# Patient Record
Sex: Male | Born: 1965
Health system: Southern US, Community
[De-identification: ages and names within clinical notes are randomized; demographics above are authoritative.]

## PROBLEM LIST (undated history)

## (undated) DIAGNOSIS — N200 Calculus of kidney: Secondary | ICD-10-CM

## (undated) DIAGNOSIS — Z860100 Personal history of colon polyps, unspecified: Secondary | ICD-10-CM

## (undated) DIAGNOSIS — G4733 Obstructive sleep apnea (adult) (pediatric): Secondary | ICD-10-CM

## (undated) DIAGNOSIS — Z9989 Dependence on other enabling machines and devices: Secondary | ICD-10-CM

## (undated) DIAGNOSIS — E669 Obesity, unspecified: Secondary | ICD-10-CM

## (undated) DIAGNOSIS — K224 Dyskinesia of esophagus: Secondary | ICD-10-CM

## (undated) DIAGNOSIS — E119 Type 2 diabetes mellitus without complications: Secondary | ICD-10-CM

## (undated) HISTORY — PX: NM RENAL LASIX (ARMC HX): HXRAD1213

## (undated) HISTORY — DX: Obesity, unspecified: E66.9

## (undated) HISTORY — DX: Type 2 diabetes mellitus without complications: E11.9

## (undated) HISTORY — DX: Personal history of colonic polyps: Z86.010

## (undated) HISTORY — DX: Personal history of colon polyps, unspecified: Z86.0100

---

## 2009-07-01 HISTORY — PX: CARDIAC CATHETERIZATION: SHX172

## 2014-05-01 DIAGNOSIS — N200 Calculus of kidney: Secondary | ICD-10-CM

## 2014-05-01 HISTORY — DX: Calculus of kidney: N20.0

## 2014-05-26 ENCOUNTER — Encounter (HOSPITAL_BASED_OUTPATIENT_CLINIC_OR_DEPARTMENT_OTHER): Payer: Self-pay | Admitting: Emergency Medicine

## 2014-05-26 ENCOUNTER — Emergency Department (HOSPITAL_BASED_OUTPATIENT_CLINIC_OR_DEPARTMENT_OTHER): Payer: Self-pay

## 2014-05-26 ENCOUNTER — Emergency Department (HOSPITAL_BASED_OUTPATIENT_CLINIC_OR_DEPARTMENT_OTHER)
Admission: EM | Admit: 2014-05-26 | Discharge: 2014-05-26 | Disposition: A | Discharge status: Home / Self Care | Payer: Self-pay | Attending: Emergency Medicine | Admitting: Emergency Medicine

## 2014-05-26 DIAGNOSIS — N200 Calculus of kidney: Secondary | ICD-10-CM

## 2014-05-26 DIAGNOSIS — R52 Pain, unspecified: Secondary | ICD-10-CM

## 2014-05-26 DIAGNOSIS — Z8719 Personal history of other diseases of the digestive system: Secondary | ICD-10-CM | POA: Insufficient documentation

## 2014-05-26 DIAGNOSIS — Z8669 Personal history of other diseases of the nervous system and sense organs: Secondary | ICD-10-CM | POA: Insufficient documentation

## 2014-05-26 DIAGNOSIS — Z79899 Other long term (current) drug therapy: Secondary | ICD-10-CM | POA: Insufficient documentation

## 2014-05-26 HISTORY — DX: Dyskinesia of esophagus: K22.4

## 2014-05-26 LAB — URINALYSIS, ROUTINE W REFLEX MICROSCOPIC
Bilirubin Urine: NEGATIVE
Glucose, UA: NEGATIVE mg/dL
KETONES UR: 15 mg/dL — AB
NITRITE: NEGATIVE
PROTEIN: 30 mg/dL — AB
Specific Gravity, Urine: 1.024 (ref 1.005–1.030)
UROBILINOGEN UA: 1 mg/dL (ref 0.0–1.0)
pH: 7 (ref 5.0–8.0)

## 2014-05-26 LAB — CBC WITH DIFFERENTIAL/PLATELET
BASOS ABS: 0 10*3/uL (ref 0.0–0.1)
Basophils Relative: 0 % (ref 0–1)
Eosinophils Absolute: 0.3 10*3/uL (ref 0.0–0.7)
Eosinophils Relative: 4 % (ref 0–5)
HCT: 43.9 % (ref 39.0–52.0)
Hemoglobin: 15 g/dL (ref 13.0–17.0)
LYMPHS PCT: 32 % (ref 12–46)
Lymphs Abs: 2.5 10*3/uL (ref 0.7–4.0)
MCH: 29.1 pg (ref 26.0–34.0)
MCHC: 34.2 g/dL (ref 30.0–36.0)
MCV: 85.1 fL (ref 78.0–100.0)
Monocytes Absolute: 0.6 10*3/uL (ref 0.1–1.0)
Monocytes Relative: 7 % (ref 3–12)
Neutro Abs: 4.5 10*3/uL (ref 1.7–7.7)
Neutrophils Relative %: 57 % (ref 43–77)
Platelets: 196 10*3/uL (ref 150–400)
RBC: 5.16 MIL/uL (ref 4.22–5.81)
RDW: 12.9 % (ref 11.5–15.5)
WBC: 7.8 10*3/uL (ref 4.0–10.5)

## 2014-05-26 LAB — COMPREHENSIVE METABOLIC PANEL
ALT: 38 U/L (ref 0–53)
ANION GAP: 17 — AB (ref 5–15)
AST: 25 U/L (ref 0–37)
Albumin: 4.1 g/dL (ref 3.5–5.2)
Alkaline Phosphatase: 54 U/L (ref 39–117)
BUN: 12 mg/dL (ref 6–23)
CO2: 23 mEq/L (ref 19–32)
CREATININE: 1.1 mg/dL (ref 0.50–1.35)
Calcium: 9.9 mg/dL (ref 8.4–10.5)
Chloride: 99 mEq/L (ref 96–112)
GFR calc non Af Amer: 78 mL/min — ABNORMAL LOW (ref >90)
Glucose, Bld: 237 mg/dL — ABNORMAL HIGH (ref 70–99)
Potassium: 3.9 mEq/L (ref 3.7–5.3)
Sodium: 139 mEq/L (ref 137–147)
Total Bilirubin: 0.4 mg/dL (ref 0.3–1.2)
Total Protein: 8.1 g/dL (ref 6.0–8.3)

## 2014-05-26 LAB — LIPASE, BLOOD: Lipase: 41 U/L (ref 11–59)

## 2014-05-26 LAB — URINE MICROSCOPIC-ADD ON

## 2014-05-26 MED ORDER — OXYCODONE-ACETAMINOPHEN 5-325 MG PO TABS: 1.0000 | ORAL_TABLET | Freq: Four times a day (QID) | ORAL | Status: DC | PRN

## 2014-05-26 MED ORDER — DICLOFENAC SODIUM ER 100 MG PO TB24: 100.0000 mg | ORAL_TABLET | Freq: Every day | ORAL | Status: DC

## 2014-05-26 MED ORDER — HYDROMORPHONE HCL 1 MG/ML IJ SOLN
1.0000 mg | Freq: Once | INTRAMUSCULAR | Status: AC
  Administered 2014-05-26: 1 mg via INTRAVENOUS

## 2014-05-26 MED ORDER — KETOROLAC TROMETHAMINE 30 MG/ML IJ SOLN
30.0000 mg | Freq: Once | INTRAMUSCULAR | Status: AC
  Administered 2014-05-26: 30 mg via INTRAVENOUS
  Filled 2014-05-26: qty 1

## 2014-05-26 MED ORDER — ONDANSETRON 8 MG PO TBDP: ORAL_TABLET | ORAL | Status: DC

## 2014-05-26 MED ORDER — OXYCODONE-ACETAMINOPHEN 5-325 MG PO TABS
2.0000 | ORAL_TABLET | Freq: Once | ORAL | Status: AC
  Administered 2014-05-26: 2 via ORAL
  Filled 2014-05-26: qty 2

## 2014-05-26 MED ORDER — IOHEXOL 300 MG/ML  SOLN
100.0000 mL | Freq: Once | INTRAMUSCULAR | Status: AC | PRN
  Administered 2014-05-26: 100 mL via INTRAVENOUS

## 2014-05-26 MED ORDER — IOHEXOL 300 MG/ML  SOLN
25.0000 mL | Freq: Once | INTRAMUSCULAR | Status: AC | PRN
  Administered 2014-05-26: 25 mL via ORAL

## 2014-05-26 MED ORDER — DICYCLOMINE HCL 10 MG/ML IM SOLN
20.0000 mg | Freq: Once | INTRAMUSCULAR | Status: AC
  Administered 2014-05-26: 20 mg via INTRAMUSCULAR
  Filled 2014-05-26: qty 2

## 2014-05-26 MED ORDER — SODIUM CHLORIDE 0.9 % IV BOLUS (SEPSIS)
500.0000 mL | Freq: Once | INTRAVENOUS | Status: AC
Start: 2014-05-26 — End: 2014-05-26
  Administered 2014-05-26: 500 mL via INTRAVENOUS

## 2014-05-26 MED ORDER — HYDROMORPHONE HCL 1 MG/ML IJ SOLN
INTRAMUSCULAR | Status: AC
Start: 2014-05-26 — End: 2014-05-26
  Administered 2014-05-26: 1 mg via INTRAVENOUS
  Filled 2014-05-26: qty 1

## 2014-05-26 MED ORDER — ONDANSETRON HCL 4 MG/2ML IJ SOLN
INTRAMUSCULAR | Status: AC
  Administered 2014-05-26: 4 mg via INTRAVENOUS
  Filled 2014-05-26: qty 2

## 2014-05-26 MED ORDER — TAMSULOSIN HCL 0.4 MG PO CAPS: 0.4000 mg | ORAL_CAPSULE | Freq: Every day | ORAL | Status: DC

## 2014-05-26 MED ORDER — HYDROMORPHONE HCL 1 MG/ML IJ SOLN
INTRAMUSCULAR | Status: DC
Start: 2014-05-26 — End: 2014-05-26
  Filled 2014-05-26: qty 1

## 2014-05-26 MED ORDER — TAMSULOSIN HCL 0.4 MG PO CAPS
0.4000 mg | ORAL_CAPSULE | Freq: Every day | ORAL | Status: DC
  Administered 2014-05-26: 0.4 mg via ORAL
  Filled 2014-05-26: qty 1

## 2014-05-26 MED ORDER — SODIUM CHLORIDE 0.9 % IV SOLN
Freq: Once | INTRAVENOUS | Status: AC
  Administered 2014-05-26: 1000 mL/h via INTRAVENOUS

## 2014-05-26 MED ORDER — ONDANSETRON HCL 4 MG/2ML IJ SOLN
4.0000 mg | Freq: Once | INTRAMUSCULAR | Status: AC
  Administered 2014-05-26: 4 mg via INTRAVENOUS

## 2014-05-26 NOTE — ED Notes (Signed)
C/o ruq pain, feels like needs to have bm, after bm tonight pt developed severe pain

## 2014-05-26 NOTE — ED Notes (Signed)
MD at bedside. 

## 2014-05-26 NOTE — ED Provider Notes (Signed)
CSN: [id number]     Arrival date & time 05/26/14  0250 History   First MD Initiated Contact with Patient 05/26/14 0251     Chief Complaint  Patient presents with  . Abdominal Pain     (Consider location/radiation/quality/duration/timing/severity/associated sxs/prior Treatment) Patient is a 48 y.o. male presenting with abdominal pain. The history is provided by the patient.  Abdominal Pain Pain location:  RUQ Pain quality: cramping   Pain radiates to:  Does not radiate Pain severity:  Severe Onset quality:  Sudden Timing:  Constant Progression:  Unchanged Chronicity:  New Context: not previous surgeries   Relieved by:  Nothing Worsened by:  Bowel movements Ineffective treatments:  None tried Associated symptoms: no chest pain, no constipation, no dysuria, no hematuria and no shortness of breath   Risk factors: no recent hospitalization     Past Medical History  Diagnosis Date  . Esophageal spasm   . Sleep apnea    History reviewed. No pertinent past surgical history. History reviewed. No pertinent family history. History  Substance Use Topics  . Smoking status: Never Smoker   . Smokeless tobacco: Not on file  . Alcohol Use: No    Review of Systems  Respiratory: Negative for shortness of breath.   Cardiovascular: Negative for chest pain.  Gastrointestinal: Positive for abdominal pain. Negative for constipation.  Genitourinary: Negative for dysuria and hematuria.  All other systems reviewed and are negative.     Allergies  Review of patient's allergies indicates no known allergies.  Home Medications   Prior to Admission medications   Medication Sig Start Date End Date Taking? Authorizing Provider  verapamil (CALAN) 120 MG tablet Take 120 mg by mouth 3 (three) times daily.   Yes Historical Provider, MD   BP 157/93 mmHg  Pulse 78  Temp(Src) 98.9 F (37.2 C) (Oral)  Resp 20  Ht 6\' 1"  (1.854 m)  Wt 350 lb (158.759 kg)  BMI 46.19 kg/m2  SpO2  100% Physical Exam  Constitutional: He is oriented to person, place, and time. He appears well-developed and well-nourished. No distress.  HENT:  Head: Normocephalic and atraumatic.  Mouth/Throat: Oropharynx is clear and moist.  Eyes: Conjunctivae are normal. Pupils are equal, round, and reactive to light.  Neck: Normal range of motion. Neck supple.  Cardiovascular: Normal rate and regular rhythm.   Pulmonary/Chest: Effort normal and breath sounds normal. He has no wheezes. He has no rales.  Abdominal: Soft. Bowel sounds are normal. There is tenderness in the right upper quadrant. There is no rigidity, no rebound, no guarding, no tenderness at McBurney's point and negative Murphy's sign.  Musculoskeletal: Normal range of motion.  Neurological: He is alert and oriented to person, place, and time.  Skin: Skin is warm and dry.  Psychiatric: He has a normal mood and affect.    ED Course  Procedures (including critical care time) Labs Review Labs Reviewed  COMPREHENSIVE METABOLIC PANEL - Abnormal; Notable for the following:    Glucose, Bld 237 (*)    GFR calc non Af Amer 78 (*)    Anion gap 17 (*)    All other components within normal limits  URINALYSIS, ROUTINE W REFLEX MICROSCOPIC - Abnormal; Notable for the following:    Color, Urine AMBER (*)    APPearance CLOUDY (*)    Hgb urine dipstick LARGE (*)    Ketones, ur 15 (*)    Protein, ur 30 (*)    Leukocytes, UA SMALL (*)    All other components  within normal limits  CBC WITH DIFFERENTIAL  LIPASE, BLOOD  URINE MICROSCOPIC-ADD ON    Imaging Review Ct Abdomen Pelvis W Contrast  05/26/2014   CLINICAL DATA:  Right flank and abdominal pain.  EXAM: CT ABDOMEN AND PELVIS WITH CONTRAST  TECHNIQUE: Multidetector CT imaging of the abdomen and pelvis was performed using the standard protocol following bolus administration of intravenous contrast.  CONTRAST:  43mL OMNIPAQUE IOHEXOL 300 MG/ML SOLN, 152mL OMNIPAQUE IOHEXOL 300 MG/ML SOLN   COMPARISON:  None.  FINDINGS: BODY WALL: Unremarkable.  LOWER CHEST: Unremarkable.  ABDOMEN/PELVIS:  Liver: Diffuse fatty infiltration.  Biliary: No evidence of biliary obstruction or stone.  Pancreas: Unremarkable.  Spleen: Unremarkable.  Adrenals: Unremarkable.  Kidneys and ureters: There is a 4 mm stone in the distal right ureter with periureteral edema and mild hydronephrosis. No additional urolithiasis identified.  Bladder: Unremarkable.  Reproductive: Unremarkable.  Bowel: No obstruction. Normal appendix.  Retroperitoneum: No mass or adenopathy.  Peritoneum: No ascites or pneumoperitoneum.  Vascular: No acute abnormality.  OSSEOUS: No acute abnormalities.  IMPRESSION: 1. 4 mm stone in the distal right ureter with mild obstructive uropathy. 2. Hepatic steatosis.   Electronically Signed   By: Jorje Guild M.D.   On: 05/26/2014 04:04     EKG Interpretation   Date/Time:  Thursday May 26 2014 02:55:24 EST Ventricular Rate:  66 PR Interval:  154 QRS Duration: 100 QT Interval:  402 QTC Calculation: 421 R Axis:   66 Text Interpretation:  Normal sinus rhythm Confirmed by [facility]  MD,  Peg Fifer ([id number]) on 05/26/2014 3:03:00 AM      MDM   Final diagnoses:  Pain   Medications  HYDROmorphone (DILAUDID) injection 1 mg (1 mg Intravenous Given 05/26/14 0257)  dicyclomine (BENTYL) injection 20 mg (20 mg Intramuscular Given 05/26/14 0257)  ondansetron (ZOFRAN) injection 4 mg (4 mg Intravenous Given 05/26/14 0257)  sodium chloride 0.9 % bolus 500 mL (0 mLs Intravenous Stopped 05/26/14 0339)  HYDROmorphone (DILAUDID) injection 1 mg (1 mg Intravenous Given 05/26/14 0307)  iohexol (OMNIPAQUE) 300 MG/ML solution 25 mL (25 mLs Oral Contrast Given 05/26/14 0346)  iohexol (OMNIPAQUE) 300 MG/ML solution 100 mL (100 mLs Intravenous Contrast Given 05/26/14 0346)  ketorolac (TORADOL) 30 MG/ML injection 30 mg (30 mg Intravenous Given 05/26/14 0338)  0.9 %  sodium chloride infusion (1,000 mL/hr  Intravenous New Bag/Given 05/26/14 0339)    Will prescribe flomax, percocet, zofran ODT, voltaren and give strainer.  Follow up with urology in 7 days.  Strain all urine.  Give elevated blood sugar follow up with your PMD for ongoing testing adhere to a lower carb diet and drink lots of water.      Carlisle Beers, MD 05/26/14 [phone]

## 2014-10-20 ENCOUNTER — Encounter (HOSPITAL_BASED_OUTPATIENT_CLINIC_OR_DEPARTMENT_OTHER): Payer: Self-pay | Admitting: *Deleted

## 2014-10-20 ENCOUNTER — Inpatient Hospital Stay (HOSPITAL_BASED_OUTPATIENT_CLINIC_OR_DEPARTMENT_OTHER)
Admission: EM | Admit: 2014-10-20 | Discharge: 2014-10-22 | DRG: 153 | Disposition: A | Discharge status: Home / Self Care | Payer: 59 | Attending: Internal Medicine | Admitting: Internal Medicine

## 2014-10-20 ENCOUNTER — Emergency Department (HOSPITAL_BASED_OUTPATIENT_CLINIC_OR_DEPARTMENT_OTHER): Payer: 59

## 2014-10-20 DIAGNOSIS — R739 Hyperglycemia, unspecified: Secondary | ICD-10-CM | POA: Diagnosis present

## 2014-10-20 DIAGNOSIS — T380X5A Adverse effect of glucocorticoids and synthetic analogues, initial encounter: Secondary | ICD-10-CM | POA: Diagnosis present

## 2014-10-20 DIAGNOSIS — G4733 Obstructive sleep apnea (adult) (pediatric): Secondary | ICD-10-CM | POA: Diagnosis present

## 2014-10-20 DIAGNOSIS — R0609 Other forms of dyspnea: Secondary | ICD-10-CM | POA: Diagnosis not present

## 2014-10-20 DIAGNOSIS — E669 Obesity, unspecified: Secondary | ICD-10-CM | POA: Diagnosis present

## 2014-10-20 DIAGNOSIS — Z7901 Long term (current) use of anticoagulants: Secondary | ICD-10-CM | POA: Diagnosis not present

## 2014-10-20 DIAGNOSIS — I313 Pericardial effusion (noninflammatory): Secondary | ICD-10-CM | POA: Diagnosis present

## 2014-10-20 DIAGNOSIS — J9801 Acute bronchospasm: Secondary | ICD-10-CM | POA: Diagnosis present

## 2014-10-20 DIAGNOSIS — Z6841 Body Mass Index (BMI) 40.0 and over, adult: Secondary | ICD-10-CM

## 2014-10-20 DIAGNOSIS — Z21 Asymptomatic human immunodeficiency virus [HIV] infection status: Secondary | ICD-10-CM | POA: Diagnosis present

## 2014-10-20 DIAGNOSIS — I300 Acute nonspecific idiopathic pericarditis: Secondary | ICD-10-CM | POA: Diagnosis not present

## 2014-10-20 DIAGNOSIS — Z7982 Long term (current) use of aspirin: Secondary | ICD-10-CM

## 2014-10-20 DIAGNOSIS — I319 Disease of pericardium, unspecified: Secondary | ICD-10-CM | POA: Diagnosis present

## 2014-10-20 DIAGNOSIS — Z87442 Personal history of urinary calculi: Secondary | ICD-10-CM | POA: Diagnosis not present

## 2014-10-20 DIAGNOSIS — G4730 Sleep apnea, unspecified: Secondary | ICD-10-CM | POA: Diagnosis not present

## 2014-10-20 DIAGNOSIS — I3139 Other pericardial effusion (noninflammatory): Secondary | ICD-10-CM

## 2014-10-20 DIAGNOSIS — K224 Dyskinesia of esophagus: Secondary | ICD-10-CM | POA: Diagnosis present

## 2014-10-20 DIAGNOSIS — J020 Streptococcal pharyngitis: Secondary | ICD-10-CM

## 2014-10-20 DIAGNOSIS — J069 Acute upper respiratory infection, unspecified: Secondary | ICD-10-CM | POA: Diagnosis present

## 2014-10-20 DIAGNOSIS — R51 Headache: Secondary | ICD-10-CM | POA: Diagnosis present

## 2014-10-20 DIAGNOSIS — R0602 Shortness of breath: Secondary | ICD-10-CM

## 2014-10-20 HISTORY — DX: Dependence on other enabling machines and devices: Z99.89

## 2014-10-20 HISTORY — DX: Calculus of kidney: N20.0

## 2014-10-20 HISTORY — DX: Obstructive sleep apnea (adult) (pediatric): G47.33

## 2014-10-20 LAB — CBC
HCT: 46.4 % (ref 39.0–52.0)
Hemoglobin: 15.9 g/dL (ref 13.0–17.0)
MCH: 28.7 pg (ref 26.0–34.0)
MCHC: 34.3 g/dL (ref 30.0–36.0)
MCV: 83.8 fL (ref 78.0–100.0)
PLATELETS: 230 10*3/uL (ref 150–400)
RBC: 5.54 MIL/uL (ref 4.22–5.81)
RDW: 12.9 % (ref 11.5–15.5)
WBC: 8.5 10*3/uL (ref 4.0–10.5)

## 2014-10-20 LAB — BASIC METABOLIC PANEL
Anion gap: 9 (ref 5–15)
BUN: 13 mg/dL (ref 6–23)
CO2: 24 mmol/L (ref 19–32)
CREATININE: 1 mg/dL (ref 0.50–1.35)
Calcium: 9.5 mg/dL (ref 8.4–10.5)
Chloride: 103 mmol/L (ref 96–112)
GFR calc Af Amer: >90 mL/min (ref >90)
GFR calc non Af Amer: 88 mL/min — ABNORMAL LOW (ref >90)
GLUCOSE: 146 mg/dL — AB (ref 70–99)
Potassium: 4.1 mmol/L (ref 3.5–5.1)
Sodium: 136 mmol/L (ref 135–145)

## 2014-10-20 LAB — BRAIN NATRIURETIC PEPTIDE: B Natriuretic Peptide: 8.8 pg/mL (ref 0.0–100.0)

## 2014-10-20 LAB — D-DIMER, QUANTITATIVE: D-Dimer, Quant: 0.36 ug/mL-FEU (ref 0.00–0.48)

## 2014-10-20 LAB — TROPONIN I

## 2014-10-20 MED ORDER — AZITHROMYCIN 250 MG PO TABS
250.0000 mg | ORAL_TABLET | Freq: Every day | ORAL | Status: DC
  Administered 2014-10-21 – 2014-10-22 (×2): 250 mg via ORAL
  Filled 2014-10-20 (×2): qty 1

## 2014-10-20 MED ORDER — METHYLPREDNISOLONE SODIUM SUCC 125 MG IJ SOLR
60.0000 mg | Freq: Two times a day (BID) | INTRAMUSCULAR | Status: AC
  Administered 2014-10-20 – 2014-10-21 (×3): 60 mg via INTRAVENOUS
  Filled 2014-10-20: qty 2
  Filled 2014-10-20 (×2): qty 0.96

## 2014-10-20 MED ORDER — PANTOPRAZOLE SODIUM 40 MG PO TBEC
40.0000 mg | DELAYED_RELEASE_TABLET | Freq: Every day | ORAL | Status: DC
  Administered 2014-10-20 – 2014-10-21 (×2): 40 mg via ORAL
  Filled 2014-10-20 (×3): qty 1

## 2014-10-20 MED ORDER — OXYCODONE-ACETAMINOPHEN 5-325 MG PO TABS: 1.0000 | ORAL_TABLET | Freq: Four times a day (QID) | ORAL | Status: DC | PRN

## 2014-10-20 MED ORDER — VERAPAMIL HCL 120 MG PO TABS
120.0000 mg | ORAL_TABLET | Freq: Every day | ORAL | Status: DC | PRN
  Filled 2014-10-20: qty 1

## 2014-10-20 MED ORDER — AZITHROMYCIN 500 MG PO TABS
500.0000 mg | ORAL_TABLET | Freq: Every day | ORAL | Status: AC
  Administered 2014-10-20: 500 mg via ORAL
  Filled 2014-10-20: qty 1

## 2014-10-20 MED ORDER — ONDANSETRON 4 MG PO TBDP
4.0000 mg | ORAL_TABLET | Freq: Three times a day (TID) | ORAL | Status: DC | PRN
  Filled 2014-10-20: qty 1

## 2014-10-20 MED ORDER — ACETAMINOPHEN 325 MG PO TABS
650.0000 mg | ORAL_TABLET | Freq: Four times a day (QID) | ORAL | Status: DC | PRN
  Filled 2014-10-20: qty 2

## 2014-10-20 MED ORDER — ACETAMINOPHEN 650 MG RE SUPP: 650.0000 mg | Freq: Four times a day (QID) | RECTAL | Status: DC | PRN

## 2014-10-20 MED ORDER — MORPHINE SULFATE 2 MG/ML IJ SOLN: 2.0000 mg | INTRAMUSCULAR | Status: DC | PRN

## 2014-10-20 MED ORDER — SODIUM CHLORIDE 0.9 % IV SOLN
INTRAVENOUS | Status: DC
  Administered 2014-10-20: via INTRAVENOUS

## 2014-10-20 MED ORDER — ALUM & MAG HYDROXIDE-SIMETH 200-200-20 MG/5ML PO SUSP: 30.0000 mL | Freq: Four times a day (QID) | ORAL | Status: DC | PRN

## 2014-10-20 MED ORDER — ASPIRIN 325 MG PO TABS
650.0000 mg | ORAL_TABLET | Freq: Three times a day (TID) | ORAL | Status: DC
  Administered 2014-10-20 – 2014-10-22 (×5): 650 mg via ORAL
  Filled 2014-10-20 (×8): qty 2

## 2014-10-20 MED ORDER — NITROGLYCERIN 0.4 MG SL SUBL: 0.4000 mg | SUBLINGUAL_TABLET | SUBLINGUAL | Status: DC | PRN

## 2014-10-20 MED ORDER — IPRATROPIUM-ALBUTEROL 0.5-2.5 (3) MG/3ML IN SOLN: 3.0000 mL | RESPIRATORY_TRACT | Status: DC

## 2014-10-20 MED ORDER — ZOLPIDEM TARTRATE 5 MG PO TABS: 10.0000 mg | ORAL_TABLET | Freq: Every evening | ORAL | Status: DC | PRN

## 2014-10-20 MED ORDER — ALBUTEROL SULFATE (2.5 MG/3ML) 0.083% IN NEBU: 2.5000 mg | INHALATION_SOLUTION | RESPIRATORY_TRACT | Status: DC | PRN

## 2014-10-20 MED ORDER — SODIUM CHLORIDE 0.9 % IJ SOLN
3.0000 mL | Freq: Two times a day (BID) | INTRAMUSCULAR | Status: DC
  Administered 2014-10-20 – 2014-10-22 (×4): 3 mL via INTRAVENOUS

## 2014-10-20 MED ORDER — GUAIFENESIN-DM 100-10 MG/5ML PO SYRP
5.0000 mL | ORAL_SOLUTION | ORAL | Status: DC | PRN
  Filled 2014-10-20: qty 5

## 2014-10-20 MED ORDER — HEPARIN SODIUM (PORCINE) 5000 UNIT/ML IJ SOLN
5000.0000 [IU] | Freq: Three times a day (TID) | INTRAMUSCULAR | Status: DC
Start: 2014-10-20 — End: 2014-10-22
  Administered 2014-10-20 – 2014-10-22 (×5): 5000 [IU] via SUBCUTANEOUS
  Filled 2014-10-20 (×8): qty 1

## 2014-10-20 NOTE — ED Notes (Signed)
MD at bedside. 

## 2014-10-20 NOTE — ED Notes (Signed)
Patient transported to X-ray 

## 2014-10-20 NOTE — ED Notes (Signed)
SOB on exertion x 1 week- recent hx of strep and treatment with augmentin

## 2014-10-20 NOTE — ED Notes (Signed)
Report rec;d, pt sitting on side of stretcher, appears SOB, cont POX readings at 100% on RA. Denies chest pain, color good

## 2014-10-20 NOTE — H&P (Signed)
[facility] History and Physical  JORRYN CASAGRANDE GEX:[id number] DOB: [birth year] DOA: 10/20/2014  Referring physician: ED physician PCP: Franki Monte, MD  Specialists:   Chief Complaint: Shortness of breath and cough  HPI: Benjamin Lee is a 49 y.o. male with past medical history OSA, esophageal spasm, history of kidney stone which passed, who presents with cough and shortness of breath.  He reports that he had headache, sore throat, fever, chills, body aching and sinus congestion last week. He was treated with a course of Augmentin empirically for strep pharyngitis. He states that he did not have strep test. He felt better after treatment, however he started having SOB on Wednesday. His SOB is aggravated by exertion. He also has dry cough. No chest pain. Patient did not have long distance traveling recently. No tenderness over calf areas. He states that he has occasional mild chest pain, which he attributes to his esophageal spasm. His chest pain is relieved by taking verapamil prn.   ROS: currently patient denies fever, chills, running nose, ear pain, headaches, abdominal pain, diarrhea, constipation, dysuria, urgency, frequency, hematuria, skin rashes, joint pain or leg swelling. No unilateral weakness, numbness or tingling sensations. No vision change or hearing loss.  In ED, patient was found to have negative d-dimer, negative chest x-ray, negative troponin, negative d-dimer, WBC 8.5, temperature normal, no tachycardia, electrolytes okay. Dr. Rosalyn Gess did bedside ultrasound, which showed small amount of pericardial effusion. EKG showed PR segment elevation in aVR, T-wave flattening in aVL. Patient is admitted to inpatient for further evaluation and treatment.  Review of Systems: As presented in the history of presenting illness, rest negative.  Where does patient live?  At home Can patient participate in ADLs? Yes  Allergy: No Known Allergies  Past Medical History  Diagnosis Date   . Esophageal spasm     "RX controlled" (10/20/2014)  . OSA on CPAP   . Kidney stones 05/2014    "passed it"    Past Surgical History  Procedure Laterality Date  . Cardiac catheterization  2011    "completely clean"    Social History:  reports that he has never smoked. He has quit using smokeless tobacco. He reports that he drinks alcohol. He reports that he does not use illicit drugs.  Family History:  Family History  Problem Relation Age of Onset  . Diabetes Mellitus II      Grandmother, mother side  . Liver cancer      Grandmother, father side     Prior to Admission medications   Medication Sig Start Date End Date Taking? Authorizing Provider  verapamil (CALAN) 120 MG tablet Take 120 mg by mouth daily.    Yes Historical Provider, MD  zolpidem (AMBIEN) 10 MG tablet Take 10 mg by mouth at bedtime as needed for sleep.   Yes Historical Provider, MD  Diclofenac Sodium CR (VOLTAREN-XR) 100 MG 24 hr tablet Take 1 tablet (100 mg total) by mouth daily. 05/26/14   April Palumbo, MD  ondansetron [facility] ODT) 8 MG disintegrating tablet 8mg  ODT q8 hours prn nausea 05/26/14   April Palumbo, MD  oxyCODONE-acetaminophen (PERCOCET) 5-325 MG per tablet Take 1-2 tablets by mouth every 6 (six) hours as needed. 05/26/14   April Palumbo, MD  tamsulosin (FLOMAX) 0.4 MG CAPS capsule Take 1 capsule (0.4 mg total) by mouth daily. 05/26/14   Veatrice Kells, MD    Physical Exam: Filed Vitals:   10/20/14 1908 10/20/14 2046 10/21/14 0004 10/21/14 0500  BP: 114/68 119/77  132/64  Pulse: 68 73 75 78  Temp:  97.9 F (36.6 C)  97.7 F (36.5 C)  TempSrc:  Oral  Oral  Resp: 20 18 18 18   Height:  6\' 1"  (1.854 m)    Weight:  151.093 kg (333 lb 1.6 oz)    SpO2: 100% 96% 95% 92%   General: Not in acute distress HEENT:       Eyes: PERRL, EOMI, no scleral icterus       ENT: No discharge from the ears and nose, no pharynx injection, no tonsillar enlargement.        Neck: No JVD, no bruit, no mass  felt. Cardiac: S1/S2, RRR, No murmurs, No gallops or rubs Pulm: coarse breathing sound. No rales, wheezing, rhonchi or rubs. Abd: Soft, nondistended, nontender, no rebound pain, no organomegaly, BS present Ext: No edema bilaterally. 2+DP/PT pulse bilaterally Musculoskeletal: No joint deformities, erythema, or stiffness, ROM full Skin: No rashes.  Neuro: Alert and oriented X3, cranial nerves II-XII grossly intact, muscle strength 5/5 in all extremeties, sensation to light touch intact.  Psych: Patient is not psychotic, no suicidal or hemocidal ideation.  Labs on Admission:  Basic Metabolic Panel:  Recent Labs Lab 10/20/14 1630  NA 136  K 4.1  CL 103  CO2 24  GLUCOSE 146*  BUN 13  CREATININE 1.00  CALCIUM 9.5   Liver Function Tests: No results for input(s): AST, ALT, ALKPHOS, BILITOT, PROT, ALBUMIN in the last 168 hours. No results for input(s): LIPASE, AMYLASE in the last 168 hours. No results for input(s): AMMONIA in the last 168 hours. CBC:  Recent Labs Lab 10/20/14 1630  WBC 8.5  HGB 15.9  HCT 46.4  MCV 83.8  PLT 230   Cardiac Enzymes:  Recent Labs Lab 10/20/14 1630  TROPONINI <0.03    BNP (last 3 results)  Recent Labs  10/20/14 1630  BNP 8.8    ProBNP (last 3 results) No results for input(s): PROBNP in the last 8760 hours.  CBG: No results for input(s): GLUCAP in the last 168 hours.  Radiological Exams on Admission: Dg Chest 2 View  10/20/2014   CLINICAL DATA:  Shortness of breath, fatigue, recent diagnosis of strep throat 2 weeks ago, past history sleep apnea  EXAM: CHEST  2 VIEW  COMPARISON:  None.  FINDINGS: Normal heart size, mediastinal contours, and pulmonary vascularity.  Lungs clear.  No pneumothorax.  Mild scattered endplate spur formation thoracic spine.  Bones otherwise unremarkable.  IMPRESSION: Normal exam.   Electronically Signed   By: Lavonia Dana M.D.   On: 10/20/2014 17:14    EKG: Independently reviewed.  Abnormal findings:  EKG  showed PR segment elevation in AVR which is consistent with pericarditis.  Assessment/Plan Principal Problem:   SOB (shortness of breath) Active Problems:   Dyspnea on exertion   Sleep apnea   Esophageal spasm   Streptococcal sore throat   Pericarditis  SOB: Etiology is not clear. D-dimer is negative ruling out pulmonary embolism. Chest x-ray is negative for pneumonia. Likely due to post viral respiratory syndrome. He was treated empirically for strep pharyngitis, however his symptoms are more consistent with viral infection, such as the flu. Atypical pneumonia is also possible etiology. -will admit to tele bed -Nebulizers: scheduled Duoneb and prn albuterol -Solu-Medrol 60 mg IV bid  -Oral azithromycin for 5 days.  -Mucinex for cough  -Urine drug screen, HIV -Urine legionella and S. pneumococcal antigen -Follow up blood culture x2, sputum culture, respiratory virus panel, Flu pcr  Pericarditis: Bed side ultrasound showed pericardial effusion. -will start ASA 650mg   Tid -start oral protonix while pt is on high dose of ASA -2e echo -trop x 3 - percocet prn  Esophageal spasm: No symptoms now. -When necessary verapamil  OSA -CPAP  DVT ppx: SQ Heparin      Code Status: Full code Family Communication: None at bed side.     Disposition Plan: Admit to inpatient   Date of Service 10/21/2014    Ivor Costa [facility] Pager [phone]  If 7PM-7AM, please contact night-coverage [email or web address] Password [facility] 10/21/2014, 5:29 AM

## 2014-10-20 NOTE — ED Provider Notes (Addendum)
CSN: [id number]     Arrival date & time 10/20/14  1610 History   First MD Initiated Contact with Patient 10/20/14 1614     Chief Complaint  Patient presents with  . Shortness of Breath     (Consider location/radiation/quality/duration/timing/severity/associated sxs/prior Treatment) Patient is a 49 y.o. male presenting with shortness of breath. The history is provided by the patient.  Shortness of Breath Severity:  Moderate Onset quality:  Gradual Duration:  6 days Timing:  Constant Progression:  Worsening Chronicity:  New Context: URI (recent strep pharyngitis, just finished course of augmentin this morning)   Context: not animal exposure and not emotional upset   Relieved by:  Rest Worsened by:  Exertion Ineffective treatments:  None tried Associated symptoms: chest pain (occasional, similar to prior esophageal spasm, none today), cough (mild, nonproductive) and diaphoresis (with his SOB)   Associated symptoms: no abdominal pain, no claudication, no ear pain, no fever, no rash, no sputum production, no syncope and no vomiting     Past Medical History  Diagnosis Date  . Esophageal spasm   . Sleep apnea    History reviewed. No pertinent past surgical history. No family history on file. History  Substance Use Topics  . Smoking status: Never Smoker   . Smokeless tobacco: Never Used  . Alcohol Use: Yes     Comment: occasional    Review of Systems  Constitutional: Positive for diaphoresis (with his SOB). Negative for fever.  HENT: Negative for ear pain.   Respiratory: Positive for cough (mild, nonproductive) and shortness of breath. Negative for sputum production.   Cardiovascular: Positive for chest pain (occasional, similar to prior esophageal spasm, none today). Negative for claudication and syncope.  Gastrointestinal: Negative for vomiting and abdominal pain.  Skin: Negative for rash.  All other systems reviewed and are negative.     Allergies  Review of  patient's allergies indicates no known allergies.  Home Medications   Prior to Admission medications   Medication Sig Start Date End Date Taking? Authorizing Provider  verapamil (CALAN) 120 MG tablet Take 120 mg by mouth daily.    Yes Historical Provider, MD  zolpidem (AMBIEN) 10 MG tablet Take 10 mg by mouth at bedtime as needed for sleep.   Yes Historical Provider, MD  Diclofenac Sodium CR (VOLTAREN-XR) 100 MG 24 hr tablet Take 1 tablet (100 mg total) by mouth daily. 05/26/14   April Palumbo, MD  ondansetron [facility] ODT) 8 MG disintegrating tablet 8mg  ODT q8 hours prn nausea 05/26/14   April Palumbo, MD  oxyCODONE-acetaminophen (PERCOCET) 5-325 MG per tablet Take 1-2 tablets by mouth every 6 (six) hours as needed. 05/26/14   April Palumbo, MD  tamsulosin (FLOMAX) 0.4 MG CAPS capsule Take 1 capsule (0.4 mg total) by mouth daily. 05/26/14   April Palumbo, MD   BP 131/85 mmHg  Pulse 87  Temp(Src) 98.3 F (36.8 C) (Oral)  Resp 19  Ht 6\' 1"  (1.854 m)  Wt 350 lb (158.759 kg)  BMI 46.19 kg/m2  SpO2 99% Physical Exam  Constitutional: He is oriented to person, place, and time. He appears well-developed and well-nourished. No distress.  HENT:  Head: Normocephalic and atraumatic.  Mouth/Throat: Oropharynx is clear and moist. No oropharyngeal exudate.  Eyes: EOM are normal. Pupils are equal, round, and reactive to light.  Neck: Normal range of motion. Neck supple. No JVD present.  Cardiovascular: Normal rate and regular rhythm.  Exam reveals no friction rub.   No murmur heard. Pulmonary/Chest: Breath sounds normal.  He is in respiratory distress (mild tachypnea, but talking in complete sentences). He has no wheezes. He has no rales.  Abdominal: He exhibits no distension. There is no tenderness. There is no rebound.  Musculoskeletal: Normal range of motion. He exhibits no edema.  Neurological: He is alert and oriented to person, place, and time.  Skin: No rash noted. He is not diaphoretic.   Nursing note and vitals reviewed.   ED Course  Procedures (including critical care time) Labs Review Labs Reviewed  CBC  BASIC METABOLIC PANEL  BRAIN NATRIURETIC PEPTIDE  TROPONIN I    Imaging Review Dg Chest 2 View  10/20/2014   CLINICAL DATA:  Shortness of breath, fatigue, recent diagnosis of strep throat 2 weeks ago, past history sleep apnea  EXAM: CHEST  2 VIEW  COMPARISON:  None.  FINDINGS: Normal heart size, mediastinal contours, and pulmonary vascularity.  Lungs clear.  No pneumothorax.  Mild scattered endplate spur formation thoracic spine.  Bones otherwise unremarkable.  IMPRESSION: Normal exam.   Electronically Signed   By: Lavonia Dana M.D.   On: 10/20/2014 17:14     EKG Interpretation   Date/Time:  Thursday October 20 2014 16:20:50 EDT Ventricular Rate:  82 PR Interval:  158 QRS Duration: 94 QT Interval:  358 QTC Calculation: 418 R Axis:   53 Text Interpretation:  Normal sinus rhythm Normal ECG No significant change  since last tracing Confirmed by Mingo Amber  MD, [place] ([id number]) on 10/20/2014  4:32:25 PM        EMERGENCY DEPARTMENT [place] CARDIAC EXAM "Study: Limited Ultrasound of the heart and pericardium"  INDICATIONS:Dyspnea Multiple views of the heart and pericardium are obtained with a multi-frequency probe.  PERFORMED OM:VEHMCN  IMAGES ARCHIVED?: Yes  FINDINGS: Small effusion, Normal contractility and Tamponade physiology absent  LIMITATIONS:  Body habitus  VIEWS USED: Parasternal long axis and Parasternal short axis  INTERPRETATION: Cardiac activity present, Pericardial effusion present and Normal contractility  COMMENT:  Unable to obtain apical 4 view of subxyphoid views.  MDM   Final diagnoses:  Shortness of breath  Pericardial effusion    49 year old male here with shortness of breath and dyspnea on exertion. He briefly finished Augmentin today for strep pharyngitis. Symptoms began about 6 days ago. Said of occasional mild chest pain consistent  with prior esophageal spasm. He has no chest pain today. He is not a smoker. Here no hypoxia, tachycardia, but he is to After walking a short distance. He denies any chest pain. EKG is similar to prior without any ischemic changes. Lungs are clear, heart sounds are normal. No JVD or peripheral edema. No basilar rales. We'll check labs, chest x-ray. PERC negative. Patient dropped his sat in the room to 88%, he told me it was a good waveform. Dimer sent and is negative. I did a bedside US, showed small pericardial effusion. I spoke with Dr. Sallyanne Kuster, who recommended Echo. Dr. Laren Everts admitting at [facility].    Evelina Bucy, MD 10/20/14 1920  Evelina Bucy, MD 10/20/14 [phone]

## 2014-10-21 ENCOUNTER — Encounter (HOSPITAL_COMMUNITY): Payer: Self-pay | Admitting: Internal Medicine

## 2014-10-21 DIAGNOSIS — I300 Acute nonspecific idiopathic pericarditis: Secondary | ICD-10-CM

## 2014-10-21 LAB — GLUCOSE, CAPILLARY
GLUCOSE-CAPILLARY: 361 mg/dL — AB (ref 70–99)
GLUCOSE-CAPILLARY: 320 mg/dL — AB (ref 70–99)

## 2014-10-21 LAB — COMPREHENSIVE METABOLIC PANEL
ALK PHOS: 46 U/L (ref 39–117)
ALT: 43 U/L (ref 0–53)
AST: 28 U/L (ref 0–37)
Albumin: 3.8 g/dL (ref 3.5–5.2)
Anion gap: 12 (ref 5–15)
BUN: 15 mg/dL (ref 6–23)
CO2: 21 mmol/L (ref 19–32)
Calcium: 9.5 mg/dL (ref 8.4–10.5)
Chloride: 103 mmol/L (ref 96–112)
Creatinine, Ser: 1.07 mg/dL (ref 0.50–1.35)
GFR calc non Af Amer: 81 mL/min — ABNORMAL LOW (ref >90)
GLUCOSE: 271 mg/dL — AB (ref 70–99)
POTASSIUM: 4.3 mmol/L (ref 3.5–5.1)
Sodium: 136 mmol/L (ref 135–145)
Total Bilirubin: 0.6 mg/dL (ref 0.3–1.2)
Total Protein: 8.5 g/dL — ABNORMAL HIGH (ref 6.0–8.3)

## 2014-10-21 LAB — INFLUENZA PANEL BY PCR (TYPE A & B)
H1N1FLUPCR: NOT DETECTED
INFLAPCR: NEGATIVE
INFLBPCR: NEGATIVE

## 2014-10-21 LAB — CBC
HCT: 44.2 % (ref 39.0–52.0)
HEMOGLOBIN: 15 g/dL (ref 13.0–17.0)
MCH: 28.7 pg (ref 26.0–34.0)
MCHC: 33.9 g/dL (ref 30.0–36.0)
MCV: 84.7 fL (ref 78.0–100.0)
Platelets: 211 10*3/uL (ref 150–400)
RBC: 5.22 MIL/uL (ref 4.22–5.81)
RDW: 12.9 % (ref 11.5–15.5)
WBC: 7.6 10*3/uL (ref 4.0–10.5)

## 2014-10-21 LAB — HIV ANTIBODY (ROUTINE TESTING W REFLEX): HIV SCREEN 4TH GENERATION: NONREACTIVE

## 2014-10-21 LAB — LEGIONELLA ANTIGEN, URINE

## 2014-10-21 LAB — STREP PNEUMONIAE URINARY ANTIGEN: Strep Pneumo Urinary Antigen: NEGATIVE

## 2014-10-21 LAB — PROTIME-INR
INR: 1.05 (ref 0.00–1.49)
PROTHROMBIN TIME: 13.8 s (ref 11.6–15.2)

## 2014-10-21 MED ORDER — INSULIN ASPART 100 UNIT/ML ~~LOC~~ SOLN
0.0000 [IU] | Freq: Every day | SUBCUTANEOUS | Status: DC
Start: 2014-10-21 — End: 2014-10-22
  Administered 2014-10-21: 4 [IU] via SUBCUTANEOUS

## 2014-10-21 MED ORDER — INSULIN ASPART 100 UNIT/ML ~~LOC~~ SOLN
0.0000 [IU] | Freq: Three times a day (TID) | SUBCUTANEOUS | Status: DC
  Administered 2014-10-21: 15 [IU] via SUBCUTANEOUS
  Administered 2014-10-22: 8 [IU] via SUBCUTANEOUS

## 2014-10-21 MED ORDER — PREDNISONE 20 MG PO TABS
40.0000 mg | ORAL_TABLET | Freq: Every day | ORAL | Status: DC
  Administered 2014-10-22: 40 mg via ORAL
  Filled 2014-10-21 (×2): qty 2

## 2014-10-21 MED ORDER — IPRATROPIUM-ALBUTEROL 0.5-2.5 (3) MG/3ML IN SOLN: 3.0000 mL | RESPIRATORY_TRACT | Status: DC | PRN

## 2014-10-21 MED ORDER — IPRATROPIUM-ALBUTEROL 0.5-2.5 (3) MG/3ML IN SOLN
3.0000 mL | RESPIRATORY_TRACT | Status: DC
  Filled 2014-10-21: qty 3

## 2014-10-21 NOTE — Progress Notes (Signed)
Pt's wife brought in his home CPAP/tubing/mask. RT checked machine and power cords. Everything looks good. RT filled humidity chamber with water for patient. CPAP of 18cmH20 is current setting. No O2. Pt understands he can call if he has questions/concerns. RT will monitor.

## 2014-10-21 NOTE — Progress Notes (Signed)
Echocardiogram 2D Echocardiogram has been performed.  Benjamin Lee 10/21/2014, 4:00 PM

## 2014-10-21 NOTE — Progress Notes (Signed)
Utilization review completed.  

## 2014-10-21 NOTE — Progress Notes (Signed)
Nutrition Brief Note  Patient identified on the Malnutrition Screening Tool (MST) Report  Per pt he lost a few pounds over the past 2 weeks due to respiratory symptoms. Pt reports good appetite currently and consuming 100% of his meals.  Wt Readings from Last 15 Encounters:  10/20/14 333 lb 1.6 oz (151.093 kg)  05/26/14 350 lb (158.759 kg)    Body mass index is 43.96 kg/(m^2). Patient meets criteria for extreme obesity, class III based on current BMI.   Current diet order is CHO Modified, patient is consuming approximately 100% of meals at this time. Labs and medications reviewed.   No nutrition interventions warranted at this time. If nutrition issues arise, please consult RD.   [place], [place], [place] Pager [phone] After Hours Pager

## 2014-10-21 NOTE — Progress Notes (Signed)
Inpatient Diabetes Program Recommendations  AACE/ADA: New Consensus Statement on Inpatient Glycemic Control (2013)  Target Ranges:  Prepandial:   less than 140 mg/dL      Peak postprandial:   less than 180 mg/dL (1-2 hours)      Critically ill patients:  140 - 180 mg/dL   Results for Benjamin Lee, Benjamin Lee (MRN [id number]) as of 10/21/2014 11:19  Ref. Range 10/20/2014 16:30 10/21/2014 07:37  Glucose Latest Ref Range: 70-99 mg/dL 146 (H) 271 (H)   Diabetes history: No Outpatient Diabetes medications: NA Current orders for Inpatient glycemic control: None  Inpatient Diabetes Program Recommendations Correction (SSI): While inpatient and ordered steroids please consider ordering CBGs with Novolog correction scale ACHS. HgbA1C: Please consider ordering an A1C to evaluate glycemic control over the past 2-3 months. Diet: Please consider changing diet from Regular to Carb Modified diet.  Thanks, Barnie Alderman, RN, MSN, CCRN, CDE Diabetes Coordinator Inpatient Diabetes Program [phone] (Team Pager from [place] to [place]) [phone] (AP office) [phone] [facility] office)

## 2014-10-21 NOTE — Progress Notes (Signed)
PROGRESS NOTE  Benjamin Lee PYP:[id number] DOB: September 29, 1965 DOA: 10/20/2014 PCP: Franki Monte, MD  Assessment/Plan: SOB: Etiology is not clear. D-dimer is negative ruling out pulmonary embolism. Chest x-ray is negative for pneumonia. Likely due to post viral respiratory syndrome. He was treated empirically for strep pharyngitis, however his symptoms are more consistent with viral infection, such as the flu. Atypical pneumonia is also possible etiology. -Nebulizers: scheduled Duoneb and prn albuterol -Solu-Medrol 60 mg IV- change to PO in AM  -Oral azithromycin for 5 days.  -Mucinex for cough  -HIV -Urine legionella and S. pneumococcal antigen -Follow up blood culture x2, sputum culture, respiratory virus panel, Flu pcr  Pericarditis: Bedside ultrasound showed pericardial effusion. - ASA 650mg  Tid -start oral protonix while pt is on high dose of ASA -2e echo -trop x 3 - percocet prn  Esophageal spasm: No symptoms now. -When necessary verapamil  OSA -CPAP  Code Status: full Family Communication: patient Disposition Plan:    Consultants:    Procedures:      HPI/Subjective: Breathing better No fevers  Objective: Filed Vitals:   10/21/14 0500  BP: 132/64  Pulse: 78  Temp: 97.7 F (36.5 C)  Resp: 18    Intake/Output Summary (Last 24 hours) at 10/21/14 1331 Last data filed at 10/21/14 0900  Gross per 24 hour  Intake    843 ml  Output    500 ml  Net    343 ml   Filed Weights   10/20/14 1613 10/20/14 2046  Weight: 158.759 kg (350 lb) 151.093 kg (333 lb 1.6 oz)    Exam:   General:  A+Ox3, NAD  Cardiovascular: rrr  Respiratory: no wheezing  Abdomen: +BS, soft  Musculoskeletal: no edema   Data Reviewed: Basic Metabolic Panel:  Recent Labs Lab 10/20/14 1630 10/21/14 0737  NA 136 136  K 4.1 4.3  CL 103 103  CO2 24 21  GLUCOSE 146* 271*  BUN 13 15  CREATININE 1.00 1.07  CALCIUM 9.5 9.5   Liver Function Tests:  Recent Labs Lab  10/21/14 0737  AST 28  ALT 43  ALKPHOS 46  BILITOT 0.6  PROT 8.5*  ALBUMIN 3.8   No results for input(s): LIPASE, AMYLASE in the last 168 hours. No results for input(s): AMMONIA in the last 168 hours. CBC:  Recent Labs Lab 10/20/14 1630 10/21/14 0737  WBC 8.5 7.6  HGB 15.9 15.0  HCT 46.4 44.2  MCV 83.8 84.7  PLT 230 211   Cardiac Enzymes:  Recent Labs Lab 10/20/14 1630  TROPONINI <0.03   BNP (last 3 results)  Recent Labs  10/20/14 1630  BNP 8.8    ProBNP (last 3 results) No results for input(s): PROBNP in the last 8760 hours.  CBG: No results for input(s): GLUCAP in the last 168 hours.  No results found for this or any previous visit (from the past 240 hour(s)).   Studies: Dg Chest 2 View  10/20/2014   CLINICAL DATA:  Shortness of breath, fatigue, recent diagnosis of strep throat 2 weeks ago, past history sleep apnea  EXAM: CHEST  2 VIEW  COMPARISON:  None.  FINDINGS: Normal heart size, mediastinal contours, and pulmonary vascularity.  Lungs clear.  No pneumothorax.  Mild scattered endplate spur formation thoracic spine.  Bones otherwise unremarkable.  IMPRESSION: Normal exam.   Electronically Signed   By: Lavonia Dana M.D.   On: 10/20/2014 17:14    Scheduled Meds: . aspirin  650 mg Oral 3 times per day  .  azithromycin  250 mg Oral Daily  . heparin  5,000 Units Subcutaneous 3 times per day  . methylPREDNISolone (SOLU-MEDROL) injection  60 mg Intravenous Q12H  . pantoprazole  40 mg Oral QHS  . [START ON 10/22/2014] predniSONE  40 mg Oral Q breakfast  . sodium chloride  3 mL Intravenous Q12H   Continuous Infusions:  Antibiotics Given (last 72 hours)    Date/Time Action Medication Dose   10/20/14 2234 Given   azithromycin (ZITHROMAX) tablet 500 mg 500 mg   10/21/14 1008 Given   azithromycin (ZITHROMAX) tablet 250 mg 250 mg      Principal Problem:   SOB (shortness of breath) Active Problems:   Dyspnea on exertion   Sleep apnea   Esophageal spasm    Streptococcal sore throat   Pericarditis    Time spent: 25 min    Mckinzee Spirito, [facility] Pager [phone]. If 7PM-7AM, please contact night-coverage at [email or web address], password [facility] 10/21/2014, 1:31 PM  LOS: 1 day

## 2014-10-21 NOTE — Progress Notes (Signed)
Pt states he does not feel he needs HHN tx's at this time and wishes them to be PRN only. Will call if he wants one. Order modified.

## 2014-10-21 NOTE — Progress Notes (Signed)
Pt stated he can place himself on and off CPAP at night. RT informed pt to call if he needed anything during the night.

## 2014-10-22 DIAGNOSIS — I313 Pericardial effusion (noninflammatory): Secondary | ICD-10-CM | POA: Insufficient documentation

## 2014-10-22 DIAGNOSIS — R0609 Other forms of dyspnea: Secondary | ICD-10-CM

## 2014-10-22 DIAGNOSIS — I3139 Other pericardial effusion (noninflammatory): Secondary | ICD-10-CM | POA: Insufficient documentation

## 2014-10-22 LAB — GLUCOSE, CAPILLARY: GLUCOSE-CAPILLARY: 273 mg/dL — AB (ref 70–99)

## 2014-10-22 MED ORDER — PREDNISONE 20 MG PO TABS: 40.0000 mg | ORAL_TABLET | Freq: Every day | ORAL | Status: DC

## 2014-10-22 MED ORDER — PREDNISONE 10 MG PO TABS: ORAL_TABLET | ORAL | Status: DC

## 2014-10-22 MED ORDER — DM-GUAIFENESIN ER 30-600 MG PO TB12
1.0000 | ORAL_TABLET | Freq: Two times a day (BID) | ORAL | Status: DC
  Filled 2014-10-22 (×2): qty 1

## 2014-10-22 MED ORDER — AZITHROMYCIN 250 MG PO TABS: 250.0000 mg | ORAL_TABLET | Freq: Every day | ORAL | Status: DC

## 2014-10-22 MED ORDER — DM-GUAIFENESIN ER 30-600 MG PO TB12: 1.0000 | ORAL_TABLET | Freq: Two times a day (BID) | ORAL | Status: DC | PRN

## 2014-10-22 NOTE — Discharge Summary (Signed)
Physician Discharge Summary  Benjamin Lee JTT:[id number] DOB: May 13, 1966 DOA: 10/20/2014  PCP: Franki Monte, MD  Admit date: 10/20/2014 Discharge date: 10/22/2014  Time spent: greater than 30 minutes  Discharge Diagnoses:  Principal Problem:   URI with bronchospasm Active Problems:   Sleep apnea   Esophageal spasm   Small pericardial effusion, possible Pericarditis Steroid induce hyperglycemia  Discharge Condition: stable  Diet recommendation: general  Filed Weights   10/20/14 1613 10/20/14 2046  Weight: 158.759 kg (350 lb) 151.093 kg (333 lb 1.6 oz)    History of present illness:  49 y.o. male with past medical history OSA, esophageal spasm, history of kidney stone which passed, who presents with cough and shortness of breath.  He reports that he had headache, sore throat, fever, chills, body aching and sinus congestion last week. He was treated with a course of Augmentin empirically for strep pharyngitis. He states that he did not have strep test. He felt better after treatment, however he started having SOB on Wednesday. His SOB is aggravated by exertion. He also has dry cough. No chest pain. Patient did not have long distance traveling recently. No tenderness over calf areas. He states that he has occasional mild chest pain, which he attributes to his esophageal spasm. His chest pain is relieved by taking verapamil prn.   In ED, patient was found to have negative d-dimer, negative chest x-ray, negative troponin, negative d-dimer, WBC 8.5, temperature normal, no tachycardia, electrolytes okay. Dr. Rosalyn Gess did bedside ultrasound, which showed small amount of pericardial effusion. EKG showed PR segment elevation in aVR, T-wave flattening in aVL. Patient is admitted to inpatient for further evaluation and treatment.  Hospital Course:   SOB: Chest x-ray is negative for pneumonia. Likely due to URI. He was treated empirically for strep pharyngitis, however his symptoms are more  consistent with viral infection. Atypical pneumonia is also possible etiology. Given Nebulizers: steroids, azithromycinMucinex for cough  Respiratory virus panel pending at discharge, but patient much improved and requesting discharge. Influenza PCR negative. HIV nonreactive.  Small pericardial effusion, possible Pericarditis: likely viral Did not have complaints of chest pain, but admitting physician thought it could be pericarditis. Started on ASA while inpatient. i see no reason to continue  Had hyperglycemia on steroids, treated with sliding scale insulin. No previous history of diabetes.  Procedures:  none  Consultations:  none  Discharge Exam: Filed Vitals:   10/22/14 0553  BP: 112/58  Pulse: 85  Temp: 97.9 F (36.6 C)  Resp: 20    General: comfortable Cardiovascular: RRR Respiratory: CTA  Discharge Instructions   Discharge Instructions    Diet - low sodium heart healthy    Complete by:  As directed      Increase activity slowly    Complete by:  As directed           Current Discharge Medication List    START taking these medications   Details  azithromycin (ZITHROMAX) 250 MG tablet Take 1 tablet (250 mg total) by mouth daily. Qty: 2 each, Refills: 0    dextromethorphan-guaiFENesin (MUCINEX DM) 30-600 MG per 12 hr tablet Take 1 tablet by mouth 2 (two) times daily as needed for cough.    predniSONE (DELTASONE) 10 MG tablet 3 tablets PO on Sunday, then 2 tablets by mouth on Monday, then 1 tablet by mouth on Tuesday, then stop Qty: 6 tablet, Refills: 0      CONTINUE these medications which have NOT CHANGED   Details  cholecalciferol (VITAMIN D)  1000 UNITS tablet Take 1,000 Units by mouth daily.    Multiple Vitamins-Minerals (MULTIVITAMIN WITH MINERALS) tablet Take 1 tablet by mouth daily.    verapamil (CALAN) 120 MG tablet Take 120 mg by mouth daily.     zolpidem (AMBIEN) 10 MG tablet Take 10 mg by mouth at bedtime as needed for sleep.     Diclofenac Sodium CR (VOLTAREN-XR) 100 MG 24 hr tablet Take 1 tablet (100 mg total) by mouth daily. Qty: 10 tablet, Refills: 0      STOP taking these medications     ondansetron (ZOFRAN ODT) 8 MG disintegrating tablet      oxyCODONE-acetaminophen (PERCOCET) 5-325 MG per tablet      tamsulosin (FLOMAX) 0.4 MG CAPS capsule        No Known Allergies Follow-up Information    Follow up with Franki Monte, MD.   Specialty:  Family Medicine   Why:  As needed       The results of significant diagnostics from this hospitalization (including imaging, microbiology, ancillary and laboratory) are listed below for reference.    Significant Diagnostic Studies: Dg Chest 2 View  10/20/2014   CLINICAL DATA:  Shortness of breath, fatigue, recent diagnosis of strep throat 2 weeks ago, past history sleep apnea  EXAM: CHEST  2 VIEW  COMPARISON:  None.  FINDINGS: Normal heart size, mediastinal contours, and pulmonary vascularity.  Lungs clear.  No pneumothorax.  Mild scattered endplate spur formation thoracic spine.  Bones otherwise unremarkable.  IMPRESSION: Normal exam.   Electronically Signed   By: Lavonia Dana M.D.   On: 10/20/2014 17:14   Echo Left ventricle: The cavity size was normal. Wall thickness was increased in a pattern of mild LVH. Systolic function was normal. The estimated ejection fraction was in the range of 50% to 55%. Wall motion was normal; there were no regional wall motion abnormalities. Left ventricular diastolic function parameters were normal. - Pericardium, extracardiac: A small, free-flowing pericardial effusion was identified anterior to the heart. The fluid had no internal echoes. Features were not consistent with tamponade physiology.  Microbiology: Recent Results (from the past 240 hour(s))  Culture, blood (routine x 2)     Status: None (Preliminary result)   Collection Time: 10/20/14 10:45 PM  Result Value Ref Range Status   Specimen  Description BLOOD RIGHT ASSIST CONTROL  Final   Special Requests BOTTLES DRAWN AEROBIC AND ANAEROBIC 10CC EA  Final   Culture   Final           BLOOD CULTURE RECEIVED NO GROWTH TO DATE CULTURE WILL BE HELD FOR 5 DAYS BEFORE ISSUING A FINAL NEGATIVE REPORT Performed at [facility]    Report Status PENDING  Incomplete  Culture, blood (routine x 2)     Status: None (Preliminary result)   Collection Time: 10/20/14 10:53 PM  Result Value Ref Range Status   Specimen Description BLOOD RIGHT HAND  Final   Special Requests BOTTLES DRAWN AEROBIC ONLY [place]  Final   Culture   Final           BLOOD CULTURE RECEIVED NO GROWTH TO DATE CULTURE WILL BE HELD FOR 5 DAYS BEFORE ISSUING A FINAL NEGATIVE REPORT Performed at [facility]    Report Status PENDING  Incomplete     Labs: Basic Metabolic Panel:  Recent Labs Lab 10/20/14 1630 10/21/14 0737  NA 136 136  K 4.1 4.3  CL 103 103  CO2 24 21  GLUCOSE 146* 271*  BUN 13  15  CREATININE 1.00 1.07  CALCIUM 9.5 9.5   Liver Function Tests:  Recent Labs Lab 10/21/14 0737  AST 28  ALT 43  ALKPHOS 46  BILITOT 0.6  PROT 8.5*  ALBUMIN 3.8   No results for input(s): LIPASE, AMYLASE in the last 168 hours. No results for input(s): AMMONIA in the last 168 hours. CBC:  Recent Labs Lab 10/20/14 1630 10/21/14 0737  WBC 8.5 7.6  HGB 15.9 15.0  HCT 46.4 44.2  MCV 83.8 84.7  PLT 230 211   Cardiac Enzymes:  Recent Labs Lab 10/20/14 1630  TROPONINI <0.03   BNP: BNP (last 3 results)  Recent Labs  10/20/14 1630  BNP 8.8    ProBNP (last 3 results) No results for input(s): PROBNP in the last 8760 hours.  CBG:  Recent Labs Lab 10/21/14 1654 10/21/14 2152 10/22/14 0746  GLUCAP 361* 320* 273*       Signed:  Zaara Sprowl L  [facility] 10/22/2014, 9:46 AM

## 2014-10-22 NOTE — Progress Notes (Signed)
Nsg Discharge Note  Admit Date:  10/20/2014 Discharge date: 10/22/2014   Katy Fitch to be D/C'd Home per MD order.  AVS completed.  Copy for chart, and copy for patient signed, and dated. Patient/caregiver able to verbalize understanding.  Discharge Medication:   Medication List    STOP taking these medications        ondansetron 8 MG disintegrating tablet  Commonly known as:  ZOFRAN ODT     oxyCODONE-acetaminophen 5-325 MG per tablet  Commonly known as:  PERCOCET     tamsulosin 0.4 MG Caps capsule  Commonly known as:  FLOMAX      TAKE these medications        azithromycin 250 MG tablet  Commonly known as:  ZITHROMAX  Take 1 tablet (250 mg total) by mouth daily.  Start taking on:  10/23/2014     cholecalciferol 1000 UNITS tablet  Commonly known as:  VITAMIN D  Take 1,000 Units by mouth daily.     dextromethorphan-guaiFENesin 30-600 MG per 12 hr tablet  Commonly known as:  MUCINEX DM  Take 1 tablet by mouth 2 (two) times daily as needed for cough.     Diclofenac Sodium CR 100 MG 24 hr tablet  Commonly known as:  VOLTAREN-XR  Take 1 tablet (100 mg total) by mouth daily.     multivitamin with minerals tablet  Take 1 tablet by mouth daily.     predniSONE 10 MG tablet  Commonly known as:  DELTASONE  3 tablets PO on Sunday, then 2 tablets by mouth on Monday, then 1 tablet by mouth on Tuesday, then stop     verapamil 120 MG tablet  Commonly known as:  CALAN  Take 120 mg by mouth daily.     zolpidem 10 MG tablet  Commonly known as:  AMBIEN  Take 10 mg by mouth at bedtime as needed for sleep.        Discharge Assessment: Filed Vitals:   10/22/14 0553  BP: 112/58  Pulse: 85  Temp: 97.9 F (36.6 C)  Resp: 20   Skin clean, dry and intact without evidence of skin break down, no evidence of skin tears noted. IV catheter discontinued intact. Site without signs and symptoms of complications - no redness or edema noted at insertion site, patient denies c/o pain  - only slight tenderness at site.  Dressing with slight pressure applied.  D/c Instructions-Education: Discharge instructions given to patient/family with verbalized understanding. D/c education completed with patient/family including follow up instructions, medication list, d/c activities limitations if indicated, with other d/c instructions as indicated by MD - patient able to verbalize understanding, all questions fully answered. Patient ambulated in the hall, did well. Pulse ox remained at 96% the entire time.  Patient instructed to return to ED, call 911, or call MD for any changes in condition.  Patient escorted via [place], and D/C home via private auto.   Dayle Points, RN 10/22/2014 11:19 AM

## 2014-10-22 NOTE — Progress Notes (Signed)
CARE MANAGEMENT NOTE 10/22/2014  Patient:  Benjamin Lee, Benjamin Lee   Account Number:  [id number]  Date Initiated:  10/22/2014  Documentation initiated by:  Tomi Bamberger  Subjective/Objective Assessment:   dx sob  admit- lives with spouse.     Action/Plan:   Anticipated DC Date:  10/22/2014   Anticipated DC Plan:  HOME/SELF CARE      DC Planning Services  CM consult      Choice offered to / List presented to:             Status of service:  Completed, signed off Medicare Important Message given?  NO (If response is "NO", the following Medicare IM given date fields will be blank) Date Medicare IM given:   Medicare IM given by:   Date Additional Medicare IM given:   Additional Medicare IM given by:    Discharge Disposition:  HOME/SELF CARE  Per UR Regulation:  Reviewed for med. necessity/level of care/duration of stay  If discussed at [place] of Stay Meetings, dates discussed:    Comments:  10/22/14 [place], BSN [phone] patient for dc , no needs.

## 2014-10-25 LAB — RESPIRATORY VIRUS PANEL
Adenovirus: NEGATIVE
INFLUENZA A: NEGATIVE
Influenza B: NEGATIVE
Metapneumovirus: NEGATIVE
PARAINFLUENZA 1 A: NEGATIVE
Parainfluenza 2: NEGATIVE
Parainfluenza 3: NEGATIVE
RESPIRATORY SYNCYTIAL VIRUS A: NEGATIVE
Respiratory Syncytial Virus B: NEGATIVE
Rhinovirus: NEGATIVE

## 2014-10-27 LAB — CULTURE, BLOOD (ROUTINE X 2)
CULTURE: NO GROWTH
Culture: NO GROWTH

## 2015-04-10 ENCOUNTER — Encounter: Payer: Self-pay | Admitting: Family Medicine

## 2015-04-10 ENCOUNTER — Ambulatory Visit (INDEPENDENT_AMBULATORY_CARE_PROVIDER_SITE_OTHER): Payer: 59 | Admitting: Family Medicine

## 2015-04-10 ENCOUNTER — Ambulatory Visit (HOSPITAL_BASED_OUTPATIENT_CLINIC_OR_DEPARTMENT_OTHER)
Admission: RE | Admit: 2015-04-10 | Discharge: 2015-04-10 | Disposition: A | Discharge status: Home / Self Care | Payer: 59 | Source: Ambulatory Visit | Attending: Family Medicine | Admitting: Family Medicine

## 2015-04-10 VITALS — BP 145/83 | HR 78 | Ht 73.0 in | Wt 342.0 lb

## 2015-04-10 DIAGNOSIS — M1611 Unilateral primary osteoarthritis, right hip: Secondary | ICD-10-CM | POA: Diagnosis not present

## 2015-04-10 DIAGNOSIS — M25551 Pain in right hip: Secondary | ICD-10-CM | POA: Diagnosis present

## 2015-04-10 MED ORDER — HYDROCODONE-ACETAMINOPHEN 5-325 MG PO TABS: 1.0000 | ORAL_TABLET | Freq: Every evening | ORAL | Status: DC | PRN

## 2015-04-10 NOTE — Patient Instructions (Addendum)
Your pain is due to hip arthritis with some secondary hip abductor strain, anterior tibialis overuse (lower leg). These are the 4 classes of medicine you can take for this: Tylenol 500mg  1-2 tabs three times a day for pain. Norco at bedtime for severe pain. Aleve 1-2 tabs twice a day with food Glucosamine sulfate 750mg  twice a day is a supplement that may help. Capsaicin, aspercreme, or biofreeze topically up to four times a day may also help with pain. Cortisone injections are an option. It's important that you continue to stay active. Start physical therapy to strengthen muscles around the joint that hurts to take pressure off of the joint itself. Do home exercises on days you don't go to therapy. Shoe inserts with good arch support may be helpful. Heat or ice 15 minutes at a time 3-4 times a day as needed to help with pain. Water aerobics and cycling with low resistance are the best two types of exercise for arthritis. Follow up with me in 1 month.

## 2015-04-11 DIAGNOSIS — M25551 Pain in right hip: Secondary | ICD-10-CM | POA: Insufficient documentation

## 2015-04-11 NOTE — Progress Notes (Signed)
PCP: Franki Monte, MD  Subjective:   HPI: Patient is a 49 y.o. male here for right hip pain.  Patient denies known injury. States for about a month he has had mild pain in right hip area, deep anteriorly through to the back that is now more intense, 7/10 level at times and sharp. Taking ibuprofen 800mg  with some benefit. Better lying onto the right side or leaning to the right. No back pain, numbness or tingling. Has some pain in right lower leg as well lateral to tibia in midportion of the lower leg. No prior issues with hip. No skin changes, fever, other complaints.  Past Medical History  Diagnosis Date  . Esophageal spasm     "RX controlled" (10/20/2014)  . OSA on CPAP   . Kidney stones 05/2014    "passed it"    Current Outpatient Prescriptions on File Prior to Visit  Medication Sig Dispense Refill  . cholecalciferol (VITAMIN D) 1000 UNITS tablet Take 1,000 Units by mouth daily.    . Multiple Vitamins-Minerals (MULTIVITAMIN WITH MINERALS) tablet Take 1 tablet by mouth daily.    . verapamil (CALAN) 120 MG tablet Take 120 mg by mouth daily.     Marland Kitchen zolpidem (AMBIEN) 10 MG tablet Take 10 mg by mouth at bedtime as needed for sleep.     No current facility-administered medications on file prior to visit.    Past Surgical History  Procedure Laterality Date  . Cardiac catheterization  2011    "completely clean"    No Known Allergies  Social History   Social History  . Marital Status: Married    Spouse Name: N/A  . Number of Children: N/A  . Years of Education: N/A   Occupational History  . Not on file.   Social History Main Topics  . Smoking status: Never Smoker   . Smokeless tobacco: Former Systems developer     Comment: "quit chewing in the 1990's"  . Alcohol Use: 0.0 oz/week    0 Standard drinks or equivalent per week     Comment: 10/20/2014 "might have a few drinks a few times/yr"  . Drug Use: No  . Sexual Activity: Yes   Other Topics Concern  . Not on file   Social  History Narrative    Family History  Problem Relation Age of Onset  . Diabetes Mellitus II      Grandmother, mother side  . Liver cancer      Grandmother, father side    BP 145/83 mmHg  Pulse 78  Ht 6\' 1"  (1.854 m)  Wt 342 lb (155.13 kg)  BMI 45.13 kg/m2  Review of Systems: See HPI above.    Objective:  Physical Exam:  Gen: NAD  Back: No gross deformity, scoliosis. No TTP.  No midline or bony TTP. FROM without pain. Strength LEs 5/5 all muscle groups.   Trace MSRs in patellar and achilles tendons, equal bilaterally. Negative SLRs. Sensation intact to light touch bilaterally.  Right hip: Mild pain with right logroll, negative left. Mild limitation ROM both hips with internal rotation. 5-/5 strength with pain on abduction Negative fabers and piriformis stretches.  Right leg: FROM without pain on knee, ankle motions. Points to anterior tibialis location as area of pain but no tenderness here.  Assessment & Plan:  1. Right hip pain - radiographs with mild DJD only - no evidence AVN, other abnormalities.  Discussed tylenol, nsaids, glucosamine, topical medications.  Norco at bedtime as needed.  Start physical therapy and home exercises  as well.  Intraarticular injection is a consideration.  Inserts with good arch supports, heat/ice.  F/u in 1 month.

## 2015-04-11 NOTE — Assessment & Plan Note (Signed)
radiographs with mild DJD only - no evidence AVN, other abnormalities.  Discussed tylenol, nsaids, glucosamine, topical medications.  Norco at bedtime as needed.  Start physical therapy and home exercises as well.  Intraarticular injection is a consideration.  Inserts with good arch supports, heat/ice.  F/u in 1 month.

## 2015-04-17 ENCOUNTER — Ambulatory Visit: Payer: 59 | Attending: Family Medicine | Admitting: Physical Therapy

## 2015-04-17 DIAGNOSIS — M25551 Pain in right hip: Secondary | ICD-10-CM | POA: Insufficient documentation

## 2015-04-17 DIAGNOSIS — R29898 Other symptoms and signs involving the musculoskeletal system: Secondary | ICD-10-CM | POA: Diagnosis present

## 2015-04-17 NOTE — Therapy (Signed)
[facility] [address]  [place], [place], [address] Phone: [phone]   Fax:  [phone]  Physical Therapy Evaluation  Patient Details  Name: Benjamin Lee MRN: [id number] Date of Birth: 1965-09-23 Referring Provider: Dene Gentry, MD  Encounter Date: 04/17/2015      PT End of Session - 04/17/15 1612    Visit Number 1   Number of Visits 12   Date for PT Re-Evaluation 05/29/15   PT Start Time 1525   PT Stop Time 1608   PT Time Calculation (min) 43 min   Activity Tolerance Patient tolerated treatment well   Behavior During Therapy [facility] for tasks assessed/performed      Past Medical History  Diagnosis Date  . Esophageal spasm     "RX controlled" (10/20/2014)  . OSA on CPAP   . Kidney stones 05/2014    "passed it"    Past Surgical History  Procedure Laterality Date  . Cardiac catheterization  2011    "completely clean"    There were no vitals filed for this visit.  Visit Diagnosis:  Right hip pain  Right leg weakness      Subjective Assessment - 04/17/15 1529    Subjective Patient reports onset of right hip pain ~3-4 months ago without any precipating event. Patient present at varying times independent of specific activity. Pain has worsened recently, disrupting sleep, causing patient to see MD.   Limitations Sitting;Standing;Walking   How long can you stand comfortably? as few as 2-3 minutes   How long can you walk comfortably? unpredictable   Patient Stated Goals "Get the pain down or gone"   Currently in Pain? Yes   Pain Score 2   Least 0/10, Avg 4/10, Worst 9/10   Pain Location Hip   Pain Orientation Right;Anterior;Lateral  deep in joint   Pain Descriptors / Indicators Sharp   Pain Onset More than a month ago  3-4 months   Pain Frequency Intermittent   Aggravating Factors  None identified   Pain Relieving Factors Ibuprofen, heating pad, [place] to sleep at night   Effect of Pain  on Daily Activities Walking, static standing            [facility] PT Assessment - 04/17/15 1530    Assessment   Medical Diagnosis Rt hip pain   Referring Provider Dene Gentry, MD   Onset Date/Surgical Date --  3-4 months ago   Next MD Visit 05/11/15   Prior Therapy none   Balance Screen   Has the patient fallen in the past 6 months No   Has the patient had a decrease in activity level because of a fear of falling?  No   Is the patient reluctant to leave their home because of a fear of falling?  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   Leisure Competitive shooter   Observation/Other Assessments   Focus on Therapeutic Outcomes (FOTO)  60% (40% limitation); Predicted 72% (28% limitation)   ROM / Strength   AROM / PROM / Strength AROM;Strength   AROM   Overall  AROM  Within functional limits for tasks performed   Strength   Strength Assessment Site Hip;Knee   Right/Left Hip Right;Left   Right Hip Flexion 3+/5  pain with resistance   Right Hip Extension 3+/5   Right Hip ABduction 4-/5   Right Hip ADduction 3/5   Left Hip Flexion 4+/5   Left Hip Extension 4/5   Left Hip ABduction 4+/5   Left Hip ADduction 4+/5   Right/Left Knee Right;Left   Right Knee Flexion 5/5   Right Knee Extension 5/5   Left Knee Flexion 5/5   Left Knee Extension 5/5   Flexibility   Soft Tissue Assessment /Muscle Length yes   Hamstrings tight bilaterally    Piriformis tight bilaterally   Special Tests    Special Tests Hip Special Tests   Hip Special Tests  Hip Scouring;Patrick (FABER) Test;Thomas Test   Saralyn [facility]) Test   Findings Positive    Side Right   Thomas Test    Findings Positive   Side Right   Hip Scouring   Findings Positive   Side Right                   [facility] Adult PT Treatment/Exercise - 04/17/15 1530    Exercises   Exercises Knee/Hip   Knee/Hip Exercises: Stretches   Passive Hamstring Stretch Right;20 seconds;3 reps   Passive Hamstring Stretch Limitations supine with strap   Hip Flexor Stretch Right;20 seconds;3 reps   Hip Flexor Stretch Limitations modified Thomas stretch   ITB Stretch Right;20 seconds;3 reps   ITB Stretch Limitations supine with strap   Piriformis Stretch Right;20 seconds;3 reps   Piriformis Stretch Limitations KTOS supine   Other Knee/Hip Stretches Hip adductor stretch (clam position) 3x20"   Knee/Hip Exercises: Supine   Hip Adduction Isometric Both;10 reps   Hip Adduction Isometric Limitations 5" hold with small ball   Other Supine Knee/Hip Exercises Hip abduction clams with blue TB - DL 10x5"                PT Education - 04/17/15 1910    Education provided Yes   Education Details PT POC, Initial HEP   Person(s) Educated Patient   Methods Explanation;Demonstration;Handout   Comprehension Verbalized understanding;Returned demonstration;Need further instruction          PT Short Term Goals - 04/17/15 1911    PT SHORT TERM GOAL #1   Title Independent with initial HEP (05/08/15)   Time 3   Period Weeks   Status New           PT Long Term Goals - 04/17/15 1912    PT LONG TERM GOAL #1   Title Independent with advanced HEP (05/29/15)   Time 6   Period Weeks   Status New   PT LONG TERM GOAL #2   Title Patient will report worst hip pain no greater than 5/10 (05/29/15)   Time 6   Period Weeks   Status New   PT LONG TERM GOAL #3   Title Patient will demonstrate right hip strength 4/5 or greater for improved joint stability (05/29/15)   Time 6   Period Weeks   Status New   PT LONG TERM GOAL #4   Title Patient will be able to stand 10 minutes or  greater without increased right hip pain (05/29/15)   Time 6   Period Weeks   Status New   PT LONG TERM GOAL #5   Title Patient will report no sleep  disturbance due to right hip pain (05/29/15)   Time 6   Period Weeks   Status New               Plan - 04/17/15 1918    Clinical Impression Statement Patient is a 49 y/o male who presents to OP PT with 3-4 month h/o of worsening right hip pain from osteosrthritis with secondary hip abductor strain. 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 Patient demonstrates bilateral hip tightness, right > left, especially in the hamstrings, ITB, hip adductors and hip flexors. Right hip signifcantly weaker than left ranging from 3/5 to 4-/5 on right as compared to grossly 4+/5 on left.   Pt will benefit from skilled therapeutic intervention in order to improve on the following deficits Pain;Impaired flexibility;Decreased range of motion;Decreased strength;Decreased activity tolerance;Difficulty walking;Decreased balance   Rehab Potential Good   PT Frequency 2x / week   PT Duration 6 weeks   PT Treatment/Interventions Therapeutic exercise;Passive range of motion;Manual techniques;Therapeutic activities;Gait training;Ultrasound;Moist Heat;Electrical Stimulation;Neuromuscular re-education;Balance training;Patient/family education   PT Home Exercise Plan Review HEP; Right hip ROM and strengthening, Modailities PRN   Consulted and Agree with Plan of Care Patient         Problem List Patient Active Problem List   Diagnosis Date Noted  . Morbid (severe) obesity due to excess calories ([place]) 04/11/2015  . Right hip pain 04/11/2015  . Pericardial effusion   . Dyspnea on exertion 10/20/2014  . Streptococcal sore throat 10/20/2014  . SOB (shortness of breath) 10/20/2014  . Pericarditis 10/20/2014  . Sleep apnea   . Esophageal spasm     Percival Spanish, PT, MPT 04/17/2015, 7:27 PM  [facility] [address]  [place], [place], [address] Phone: [phone]   Fax:  [phone]  Name: ABHIRAM CRIADO MRN: [id number] Date of Birth: 1966-04-27

## 2015-04-20 ENCOUNTER — Ambulatory Visit: Payer: 59 | Admitting: Physical Therapy

## 2015-04-20 DIAGNOSIS — R29898 Other symptoms and signs involving the musculoskeletal system: Secondary | ICD-10-CM

## 2015-04-20 DIAGNOSIS — M25551 Pain in right hip: Secondary | ICD-10-CM | POA: Diagnosis not present

## 2015-04-20 NOTE — Therapy (Signed)
[facility] [address]  [place], [place], [address] Phone: [phone]   Fax:  [phone]  Physical Therapy Treatment  Patient Details  Name: Benjamin Lee MRN: [id number] Date of Birth: 1965-08-27 Referring Provider: Dene Gentry, MD  Encounter Date: 04/20/2015      PT End of Session - 04/20/15 1421    Visit Number 2   Number of Visits 12   Date for PT Re-Evaluation 05/29/15   PT Start Time 1340   PT Stop Time 1430   PT Time Calculation (min) 50 min   Activity Tolerance Patient tolerated treatment well   Behavior During Therapy [facility] for tasks assessed/performed      Past Medical History  Diagnosis Date  . Esophageal spasm     "RX controlled" (10/20/2014)  . OSA on CPAP   . Kidney stones 05/2014    "passed it"    Past Surgical History  Procedure Laterality Date  . Cardiac catheterization  2011    "completely clean"    There were no vitals filed for this visit.  Visit Diagnosis:  Right hip pain  Right leg weakness      Subjective Assessment - 04/20/15 1340    Subjective R hip sore; exercises going well.  Reports he feels good after exercise but pain returns after a few hours.   Patient Stated Goals "Get the pain down or gone"   Currently in Pain? Yes   Pain Score 5    Pain Location Hip   Pain Orientation Right;Anterior;Lateral   Pain Descriptors / Indicators Sharp;Discomfort   Pain Frequency Constant                         [facility] Adult PT Treatment/Exercise - 04/20/15 1341    Knee/Hip Exercises: Stretches   Passive Hamstring Stretch Right;2 reps;20 seconds   Passive Hamstring Stretch Limitations supine with strap   Hip Flexor Stretch Right;20 seconds;3 reps   Hip Flexor Stretch Limitations modified Thomas stretch   ITB Stretch Right;2 reps;30 seconds   ITB Stretch Limitations supine with strap   Piriformis Stretch Right;2 reps;20 seconds   Piriformis Stretch  Limitations KTOS supine   Other Knee/Hip Stretches Hip adductor stretch (clam position) 2x20"   Knee/Hip Exercises: Aerobic   Nustep Level 6 x 5 min   Knee/Hip Exercises: Seated   Sit to [employer] 10 reps   Knee/Hip Exercises: Supine   Hip Adduction Isometric Both;10 reps   Hip Adduction Isometric Limitations 5" hold with small ball   Straight Leg Raises Strengthening;Right;10 reps   Other Supine Knee/Hip Exercises Hip abduction clams with blue TB - DL 10x5"   Other Supine Knee/Hip Exercises bridging x 10   Modalities   Modalities Electrical Stimulation;Moist Heat   Moist Heat Therapy   Number Minutes Moist Heat 15 Minutes   Moist Heat Location Hip   Electrical Stimulation   Electrical Stimulation Location R hip   Electrical Stimulation Action IFC   Electrical Stimulation Parameters to tolerance up to 15 mA   Electrical Stimulation Goals Pain                  PT Short Term Goals - 04/20/15 1422    PT SHORT TERM GOAL #1   Title Independent with initial HEP (05/08/15)   Status Achieved           PT Long Term Goals - 04/20/15 1422    PT LONG  TERM GOAL #1   Title Independent with advanced HEP (05/29/15)   Status On-going   PT LONG TERM GOAL #2   Title Patient will report worst hip pain no greater than 5/10 (05/29/15)   Status On-going   PT LONG TERM GOAL #3   Title Patient will demonstrate right hip strength 4/5 or greater for improved joint stability (05/29/15)   Status On-going   PT LONG TERM GOAL #4   Title Patient will be able to stand 10 minutes or greater without increased right hip pain (05/29/15)   Status On-going   PT LONG TERM GOAL #5   Title Patient will report no sleep disturbance due to right hip pain (05/29/15)   Status On-going               Plan - 04/20/15 1421    Clinical Impression Statement Pt demonstrated independence with HEP and tolerated new exercises well.  Pt states pain decreased from 5/10 to 2/10 after session.   PT Home  Exercise Plan Right hip ROM and strengthening, Modailities PRN   Consulted and Agree with Plan of Care Patient        Problem List Patient Active Problem List   Diagnosis Date Noted  . Morbid (severe) obesity due to excess calories ([place]) 04/11/2015  . Right hip pain 04/11/2015  . Pericardial effusion   . Dyspnea on exertion 10/20/2014  . Streptococcal sore throat 10/20/2014  . SOB (shortness of breath) 10/20/2014  . Pericarditis 10/20/2014  . Sleep apnea   . Esophageal spasm    Laureen Abrahams, PT, DPT 04/20/2015 2:32 PM  [facility] [address]  [place], [place], [address] Phone: [phone]   Fax:  [phone]  Name: Benjamin Lee MRN: [id number] Date of Birth: 01-30-1966

## 2015-04-25 ENCOUNTER — Ambulatory Visit: Payer: 59 | Admitting: Physical Therapy

## 2015-04-25 DIAGNOSIS — R29898 Other symptoms and signs involving the musculoskeletal system: Secondary | ICD-10-CM

## 2015-04-25 DIAGNOSIS — M25551 Pain in right hip: Secondary | ICD-10-CM | POA: Diagnosis not present

## 2015-04-25 NOTE — Therapy (Signed)
[facility] [address]  [place], [place], [address] Phone: [phone]   Fax:  [phone]  Physical Therapy Treatment  Patient Details  Name: Benjamin Lee MRN: [id number] Date of Birth: 1965/10/29 Referring Provider: Dene Gentry, MD  Encounter Date: 04/25/2015      PT End of Session - 04/25/15 1530    Visit Number 3   Number of Visits 12   Date for PT Re-Evaluation 05/29/15   PT Start Time 1526   PT Stop Time 1620   PT Time Calculation (min) 54 min   Activity Tolerance Patient tolerated treatment well   Behavior During Therapy [facility] for tasks assessed/performed      Past Medical History  Diagnosis Date  . Esophageal spasm     "RX controlled" (10/20/2014)  . OSA on CPAP   . Kidney stones 05/2014    "passed it"    Past Surgical History  Procedure Laterality Date  . Cardiac catheterization  2011    "completely clean"    There were no vitals filed for this visit.  Visit Diagnosis:  Right hip pain  Right leg weakness      Subjective Assessment - 04/25/15 1528    Subjective Patient reporting pain/soreness still occuring at the same times during the day but pain seems worse when it's present. Not too bad right now but was bad this morning (5-6/10).   Patient Stated Goals "Get the pain down or gone"   Currently in Pain? Yes   Pain Score 3    Pain Location Hip   Pain Orientation Right               [facility] Adult PT Treatment/Exercise - 04/25/15 1526    Exercises   Exercises Knee/Hip   Knee/Hip Exercises: Stretches   Passive Hamstring Stretch 20 seconds;3 reps;Both   Passive Hamstring Stretch Limitations manual   Hip Flexor Stretch 20 seconds;3 reps;Both   Hip Flexor Stretch Limitations modified Thomas stretch   ITB Stretch 20 seconds;3 reps;Both   ITB Stretch Limitations manual   Piriformis Stretch 20 seconds;3 reps;Both   Piriformis Stretch Limitations KTOS supine, manual   Other  Knee/Hip Stretches Hip adductor stretch (clam position) 2x20"   Knee/Hip Exercises: Aerobic   Nustep Level 6 x 5 min   Knee/Hip Exercises: Standing   Hip Flexion Both;10 reps;Knee bent   Hip Flexion Limitations Alternating march on blue foam   Hip Abduction Both;10 reps;Knee straight   Abduction Limitations Alternating on blue foam   Hip Extension Both;10 reps;Knee straight   Extension Limitations Alternating on blue foam   Knee/Hip Exercises: Supine   Hip Adduction Isometric Both;10 reps   Hip Adduction Isometric Limitations 5" hold with small ball   Bridges Both;10 reps  3" hold   Bridges with [employer] Both;10 reps  3" hold   Other Supine Knee/Hip Exercises Hip abduction clams with blue TB - DL 10x5"   Other Supine Knee/Hip Exercises Hooklying alternating hip flexion with blue TB 10x3"   Modalities   Modalities Electrical Stimulation;Moist Heat   Moist Heat Therapy   Moist Heat Location Hip   Electrical Stimulation   Electrical Stimulation Location R hip   Electrical Stimulation Action IFC   Electrical Stimulation Parameters 40% scan, 80-150 Hz, intensity to patient tolerance up to 22 mA x15 minutes   Electrical Stimulation Goals Pain  PT Short Term Goals - 04/20/15 1422    PT SHORT TERM GOAL #1   Title Independent with initial HEP (05/08/15)   Status Achieved           PT Long Term Goals - 04/25/15 1609    PT LONG TERM GOAL #1   Title Independent with advanced HEP (05/29/15)   Status On-going   PT LONG TERM GOAL #2   Title Patient will report worst hip pain no greater than 5/10 (05/29/15)   Status On-going   PT LONG TERM GOAL #3   Title Patient will demonstrate right hip strength 4/5 or greater for improved joint stability (05/29/15)   Status On-going   PT LONG TERM GOAL #4   Title Patient will be able to stand 10 minutes or greater without increased right hip pain (05/29/15)   Status On-going   PT LONG TERM GOAL #5   Title  Patient will report no sleep disturbance due to right hip pain (05/29/15)   Status On-going               Plan - 04/25/15 1606    Clinical Impression Statement Patient reports good pain relief after stretches and exercises but effects temporary and pain intensity often worse when pain returns. Progressed exercises with addition of standing exercises on blue foam to facilitate improved stability and strength for improved muscular support of joint. Patient tolerating exercises well but fatigued quickly.   PT Home Exercise Plan Right hip ROM and strengthening, Modailities PRN   Consulted and Agree with Plan of Care Patient        Problem List Patient Active Problem List   Diagnosis Date Noted  . Morbid (severe) obesity due to excess calories ([place]) 04/11/2015  . Right hip pain 04/11/2015  . Pericardial effusion   . Dyspnea on exertion 10/20/2014  . Streptococcal sore throat 10/20/2014  . SOB (shortness of breath) 10/20/2014  . Pericarditis 10/20/2014  . Sleep apnea   . Esophageal spasm     Percival Spanish, PT, MPT 04/25/2015, 4:12 PM  [facility] [address]  [place], [place], [address] Phone: [phone]   Fax:  [phone]  Name: Benjamin Lee MRN: [id number] Date of Birth: 1965-07-28

## 2015-04-28 ENCOUNTER — Ambulatory Visit: Payer: 59 | Admitting: Physical Therapy

## 2015-04-28 DIAGNOSIS — R29898 Other symptoms and signs involving the musculoskeletal system: Secondary | ICD-10-CM

## 2015-04-28 DIAGNOSIS — M25551 Pain in right hip: Secondary | ICD-10-CM | POA: Diagnosis not present

## 2015-04-28 NOTE — Therapy (Signed)
[facility] [address]  [place], [place], [address] Phone: [phone]   Fax:  [phone]  Physical Therapy Treatment  Patient Details  Name: Benjamin Lee MRN: [id number] Date of Birth: 10/15/1965 Referring Provider: Dene Gentry, MD  Encounter Date: 04/28/2015      PT End of Session - 04/28/15 1026    Visit Number 4   Number of Visits 12   Date for PT Re-Evaluation 05/29/15   PT Start Time 0940   PT Stop Time 3329   PT Time Calculation (min) 55 min   Activity Tolerance Patient tolerated treatment well   Behavior During Therapy [facility] for tasks assessed/performed      Past Medical History  Diagnosis Date   Esophageal spasm     "RX controlled" (10/20/2014)   OSA on CPAP    Kidney stones 05/2014    "passed it"    Past Surgical History  Procedure Laterality Date   Cardiac catheterization  2011    "completely clean"    There were no vitals filed for this visit.  Visit Diagnosis:  Right hip pain  Right leg weakness      Subjective Assessment - 04/28/15 0940    Subjective "The past 24 hours or so is the best I've felt in months"                           [facility] Adult PT Treatment/Exercise - 04/28/15 0001    Exercises   Exercises Knee/Hip   Knee/Hip Exercises: Stretches   Passive Hamstring Stretch 20 seconds;3 reps;Both   Passive Hamstring Stretch Limitations manual   Hip Flexor Stretch 20 seconds;3 reps;Both   Hip Flexor Stretch Limitations modified Thomas stretch   ITB Stretch 20 seconds;3 reps;Both   ITB Stretch Limitations manual   Piriformis Stretch 20 seconds;3 reps;Both   Piriformis Stretch Limitations KTOS supine, manual   Other Knee/Hip Stretches Hip adductor stretch (clam position) 2x20"   Knee/Hip Exercises: Aerobic   Nustep Level 6 x 5 min   Knee/Hip Exercises: Standing   Hip Flexion Both;10 reps;Knee bent   Hip Flexion Limitations blue t band   Hip  Abduction Both;10 reps;Knee straight   Abduction Limitations blue t band   Hip Extension Both;10 reps;Knee straight   Extension Limitations blue t band   Functional Squat 2 sets;10 seconds   Functional Squat Limitations rail   Knee/Hip Exercises: Supine   Bridges Both;10 reps   Bridges with [employer] Both;10 reps;Strengthening   Other Supine Knee/Hip Exercises Hip abduction clams with blue TB - unilateral 10x5" B   Modalities   Modalities Electrical Stimulation;Moist Heat   Moist Heat Therapy   Number Minutes Moist Heat 15 Minutes   Moist Heat Location Hip   Electrical Stimulation   Electrical Stimulation Location R hip   Electrical Stimulation Action IFC   Electrical Stimulation Parameters 40% scan80-150Hz , to tolerance   Electrical Stimulation Goals Pain                  PT Short Term Goals - 04/20/15 1422    PT SHORT TERM GOAL #1   Title Independent with initial HEP (05/08/15)   Status Achieved           PT Long Term Goals - 04/25/15 1609    PT LONG TERM GOAL #1   Title Independent with advanced HEP (05/29/15)   Status On-going   PT LONG TERM  GOAL #2   Title Patient will report worst hip pain no greater than 5/10 (05/29/15)   Status On-going   PT LONG TERM GOAL #3   Title Patient will demonstrate right hip strength 4/5 or greater for improved joint stability (05/29/15)   Status On-going   PT LONG TERM GOAL #4   Title Patient will be able to stand 10 minutes or greater without increased right hip pain (05/29/15)   Status On-going   PT LONG TERM GOAL #5   Title Patient will report no sleep disturbance due to right hip pain (05/29/15)   Status On-going               Plan - 04/28/15 1027    Clinical Impression Statement Fatigues quickly.  Ongoing decreased flexibility throughout bilateral LE.  Good response to LE stretches with report of increased movement.     PT Next Visit Plan Cont gradual progression of LE flexibility, and increase in CKC/  functional exercises per pt tolerance.    Recommended Other Services aquatic exercise        Problem List Patient Active Problem List   Diagnosis Date Noted   Morbid (severe) obesity due to excess calories ([place]) 04/11/2015   Right hip pain 04/11/2015   Pericardial effusion    Dyspnea on exertion 10/20/2014   Streptococcal sore throat 10/20/2014   SOB (shortness of breath) 10/20/2014   Pericarditis 10/20/2014   Sleep apnea    Esophageal spasm     Olean Ree, PTA 04/28/2015, 10:30 AM  [facility] [address]  [place], [place], [address] Phone: [phone]   Fax:  [phone]  Name: Benjamin Lee MRN: [id number] Date of Birth: 06-16-1966

## 2015-05-02 ENCOUNTER — Ambulatory Visit: Payer: 59 | Attending: Family Medicine | Admitting: Physical Therapy

## 2015-05-02 DIAGNOSIS — R29898 Other symptoms and signs involving the musculoskeletal system: Secondary | ICD-10-CM | POA: Insufficient documentation

## 2015-05-02 DIAGNOSIS — M25551 Pain in right hip: Secondary | ICD-10-CM | POA: Diagnosis not present

## 2015-05-02 NOTE — Therapy (Signed)
[facility] [address]  [place], [place], [address] Phone: [phone]   Fax:  [phone]  Physical Therapy Treatment  Patient Details  Name: Benjamin Lee MRN: [id number] Date of Birth: 1966-06-02 Referring Provider: Dene Gentry, MD  Encounter Date: 05/02/2015      PT End of Session - 05/02/15 1534    Visit Number 5   Number of Visits 12   Date for PT Re-Evaluation 05/29/15   PT Start Time 4128   PT Stop Time 1622   PT Time Calculation (min) 52 min   Activity Tolerance Patient tolerated treatment well;Patient limited by pain   Behavior During Therapy [facility] for tasks assessed/performed      Past Medical History  Diagnosis Date  . Esophageal spasm     "RX controlled" (10/20/2014)  . OSA on CPAP   . Kidney stones 05/2014    "passed it"    Past Surgical History  Procedure Laterality Date  . Cardiac catheterization  2011    "completely clean"    There were no vitals filed for this visit.  Visit Diagnosis:  Right hip pain  Right leg weakness      Subjective Assessment - 05/02/15 1532    Subjective Patient reports pain had been doing better the end of last week, but started coming back late Friday evening.   Currently in Pain? Yes   Pain Score 4    Pain Location Hip   Pain Orientation Right                 [facility] Adult PT Treatment/Exercise - 05/02/15 1530    Exercises   Exercises Knee/Hip   Knee/Hip Exercises: Aerobic   Recumbent Bike lvl 2 x 5'   Knee/Hip Exercises: Standing   Hip Flexion Both;10 reps;Knee bent   Hip Flexion Limitations blue t band   Hip Abduction Both;10 reps;Knee straight   Abduction Limitations blue t band   Hip Extension Both;10 reps;Knee straight   Extension Limitations blue t band   Lateral Step Up Both;10 reps;Hand Hold: 2;Step Height: 6"   Forward Step Up Both;10 reps;Hand Hold: 2;Step Height: 6"   Functional Squat 10 reps;2 sets;3 seconds    Functional Squat Limitations rail   Knee/Hip Exercises: Supine   Bridges with Clamshell Both;10 reps;Strengthening   Other Supine Knee/Hip Exercises Hip abduction clams with blue TB - unilateral 10x5" B   Modalities   Modalities Electrical Stimulation;Moist Heat   Moist Heat Therapy   Number Minutes Moist Heat 20 Minutes   Moist Heat Location Hip   Electrical Stimulation   Electrical Stimulation Location R hip   Electrical Stimulation Action IFC   Electrical Stimulation Parameters 40% scan, 80-150 Hz, intensity to patient tolerance up to 24 mA x 20 minutes   Electrical Stimulation Goals Pain                  PT Short Term Goals - 04/20/15 1422    PT SHORT TERM GOAL #1   Title Independent with initial HEP (05/08/15)   Status Achieved           PT Long Term Goals - 05/02/15 1615    PT LONG TERM GOAL #1   Title Independent with advanced HEP (05/29/15)   Status On-going   PT LONG TERM GOAL #2   Title Patient will report worst hip pain no greater than 5/10 (05/29/15)   Status On-going   PT LONG TERM GOAL #  3   Title Patient will demonstrate right hip strength 4/5 or greater for improved joint stability (05/29/15)   Status On-going   PT LONG TERM GOAL #4   Title Patient will be able to stand 10 minutes or greater without increased right hip pain (05/29/15)   Status On-going   PT LONG TERM GOAL #5   Title Patient will report no sleep disturbance due to right hip pain (05/29/15)   Status On-going               Plan - 05/02/15 1613    Clinical Impression Statement Pain initially improving last week but now increased again limiting tolerance for therapuetic exercise progression. If increased pain persists may need to back off briefly until pain better controlled.   PT Next Visit Plan Cont gradual progression of LE flexibility, and increase in CKC/ functional exercises per pt tolerance.    Consulted and Agree with Plan of Care Patient        Problem  List Patient Active Problem List   Diagnosis Date Noted  . Morbid (severe) obesity due to excess calories ([place]) 04/11/2015  . Right hip pain 04/11/2015  . Pericardial effusion   . Dyspnea on exertion 10/20/2014  . Streptococcal sore throat 10/20/2014  . SOB (shortness of breath) 10/20/2014  . Pericarditis 10/20/2014  . Sleep apnea   . Esophageal spasm     Percival Spanish, PT, MPT 05/02/2015, 4:59 PM  [facility] [address]  [place], [place], [address] Phone: [phone]   Fax:  [phone]  Name: COSMO TETREAULT MRN: [id number] Date of Birth: [birth year]

## 2015-05-05 ENCOUNTER — Ambulatory Visit: Payer: 59 | Admitting: Physical Therapy

## 2015-05-05 DIAGNOSIS — R29898 Other symptoms and signs involving the musculoskeletal system: Secondary | ICD-10-CM

## 2015-05-05 DIAGNOSIS — M25551 Pain in right hip: Secondary | ICD-10-CM

## 2015-05-05 NOTE — Therapy (Signed)
[facility] [address]  [place], [place], [address] Phone: [phone]   Fax:  [phone]  Physical Therapy Treatment  Patient Details  Name: Benjamin Lee MRN: [id number] Date of Birth: Jul 23, 1965 Referring Provider: Dene Gentry, MD  Encounter Date: 05/05/2015      PT End of Session - 05/05/15 1108    Visit Number 6   Number of Visits 12   Date for PT Re-Evaluation 05/29/15   PT Start Time 1104   PT Stop Time 1158   PT Time Calculation (min) 54 min   Activity Tolerance Patient tolerated treatment well   Behavior During Therapy [facility] for tasks assessed/performed      Past Medical History  Diagnosis Date  . Esophageal spasm     "RX controlled" (10/20/2014)  . OSA on CPAP   . Kidney stones 05/2014    "passed it"    Past Surgical History  Procedure Laterality Date  . Cardiac catheterization  2011    "completely clean"    There were no vitals filed for this visit.  Visit Diagnosis:  Right hip pain  Right leg weakness      Subjective Assessment - 05/05/15 1107    Subjective Patient reports pain better today than last visit but still "nagging". Continues to report that pain feels better after therapy and completing exercises at home, but states effects only last for a few hours then pain returns.   Pain Score 2    Pain Location Hip   Pain Orientation Right;Lateral   Pain Descriptors / Indicators [facility] Adult PT Treatment/Exercise - 05/05/15 1104    Exercises   Exercises Knee/Hip   Knee/Hip Exercises: Aerobic   Recumbent Bike lvl 2 x 5'   Knee/Hip Exercises: Standing   Hip Flexion Both;10 reps;Knee bent   Hip Flexion Limitations Blue TB, standing on blue foam, single HHA on counter   Hip Abduction Both;10 reps;Knee straight   Abduction Limitations Blue TB, standing on blue foam, single HHA on counter   Hip Extension Both;10 reps;Knee straight   Extension  Limitations Blue TB, standing on blue foam, single HHA on counter   Lateral Step Up Both;10 reps;Hand Hold: 1;Step Height: 8"   Forward Step Up Both;10 reps;Hand Hold: 1;Step Height: 8"   Step Down Both;10 reps;Hand Hold: 1;Step Height: 4"   Step Down Limitations eccentric reach down with heel touch   Functional Squat 10 reps;2 sets;3 seconds   Functional Squat Limitations TRX   Knee/Hip Exercises: Supine   Bridges with Clamshell Both;10 reps;Strengthening  Black TB   Other Supine Knee/Hip Exercises Hip abduction clams with black TB - unilateral 10x5" B   Modalities   Modalities Electrical Stimulation   Moist Heat Therapy   Number Minutes Moist Heat 15 Minutes   Moist Heat Location Hip   Electrical Stimulation   Electrical Stimulation Location R hip   Electrical Stimulation Action IFC   Electrical Stimulation Parameters 40% scan, 80-150 Hz, intensity to patient tolerance up to 24 mA x 15 minutes   Electrical Stimulation Goals Pain                  PT Short Term Goals - 04/20/15 1422    PT SHORT TERM GOAL #1   Title Independent with initial HEP (05/08/15)   Status Achieved  PT Long Term Goals - 05/02/15 1615    PT LONG TERM GOAL #1   Title Independent with advanced HEP (05/29/15)   Status On-going   PT LONG TERM GOAL #2   Title Patient will report worst hip pain no greater than 5/10 (05/29/15)   Status On-going   PT LONG TERM GOAL #3   Title Patient will demonstrate right hip strength 4/5 or greater for improved joint stability (05/29/15)   Status On-going   PT LONG TERM GOAL #4   Title Patient will be able to stand 10 minutes or greater without increased right hip pain (05/29/15)   Status On-going   PT LONG TERM GOAL #5   Title Patient will report no sleep disturbance due to right hip pain (05/29/15)   Status On-going               Plan - 05/05/15 1147    Clinical Impression Statement Pain better today with patient continuing to report  good relief of pain after therapy and/or completing HEP/stretches, but patient lack of long term relief of pain as he reports relief typically only last a few hours. Patient continues to tolerate gradual progression of exercises without complaints.   PT Next Visit Plan Cont gradual progression of LE flexibility, and increase in CKC/ functional exercises per pt tolerance.         Problem List Patient Active Problem List   Diagnosis Date Noted  . Morbid (severe) obesity due to excess calories ([place]) 04/11/2015  . Right hip pain 04/11/2015  . Pericardial effusion   . Dyspnea on exertion 10/20/2014  . Streptococcal sore throat 10/20/2014  . SOB (shortness of breath) 10/20/2014  . Pericarditis 10/20/2014  . Sleep apnea   . Esophageal spasm     Percival Spanish, PT, MPT 05/05/2015, 12:05 PM  [facility] [address]  [place], [place], [address] Phone: [phone]   Fax:  [phone]  Name: Benjamin Lee MRN: [id number] Date of Birth: 1965/09/04

## 2015-05-09 ENCOUNTER — Ambulatory Visit: Payer: 59 | Admitting: Physical Therapy

## 2015-05-09 DIAGNOSIS — M25551 Pain in right hip: Secondary | ICD-10-CM | POA: Diagnosis not present

## 2015-05-09 DIAGNOSIS — R29898 Other symptoms and signs involving the musculoskeletal system: Secondary | ICD-10-CM

## 2015-05-09 NOTE — Therapy (Signed)
[facility] [address]  [place], [place], [address] Phone: [phone]   Fax:  [phone]  Physical Therapy Treatment  Patient Details  Name: Benjamin Lee MRN: [id number] Date of Birth: 07/10/1965 Referring Provider: Dene Gentry, MD  Encounter Date: 05/09/2015      PT End of Session - 05/09/15 1453    Visit Number 7   Number of Visits 12   Date for PT Re-Evaluation 05/29/15   PT Start Time 1448   PT Stop Time 1519   PT Time Calculation (min) 31 min   Activity Tolerance Patient tolerated treatment well   Behavior During Therapy [facility] for tasks assessed/performed      Past Medical History  Diagnosis Date  . Esophageal spasm     "RX controlled" (10/20/2014)  . OSA on CPAP   . Kidney stones 05/2014    "passed it"    Past Surgical History  Procedure Laterality Date  . Cardiac catheterization  2011    "completely clean"    There were no vitals filed for this visit.  Visit Diagnosis:  Right hip pain  Right leg weakness      Subjective Assessment - 05/09/15 1452    Subjective Patient stating "apparently all I needed to do last week was whine, because I've been feelling great all weekend".   Currently in Pain? No/denies                 [facility] Adult PT Treatment/Exercise - 05/09/15 1448    Exercises   Exercises Knee/Hip   Knee/Hip Exercises: Aerobic   Recumbent Bike lvl 3 x 5'   Knee/Hip Exercises: Standing   Hip Flexion Both;10 reps;Knee straight   Hip Flexion Limitations Blue TB, standing on blue foam oval (done in sequence with hip Abd & Ext) , 2 pole A   Hip Abduction Both;10 reps;Knee straight   Abduction Limitations Blue TB, standing on blue foam oval (done in sequence with hip Flex & Ext) , 2 pole A   Hip Extension Both;10 reps;Knee straight   Extension Limitations Blue TB, standing on blue foam oval (done in sequence with hip Flex & Abd) , 2 pole A   Lateral Step Up Both;10  reps;Hand Hold: 2   Lateral Step Up Limitations BOSU (up) with TRX   Step Down Both;10 reps;Hand Hold: 2;Step Height: 6"   Step Down Limitations eccentric reach down with heel touch   Functional Squat 15 reps;2 sets   Functional Squat Limitations TRX   Walking with Sports Cord Sidestepping with blue TB bilateral 69ft x 3   Knee/Hip Exercises: Supine   Bridges with Clamshell Both;Strengthening;15 reps  Black TB   Other Supine Knee/Hip Exercises Alternating Hip ext/quad isometric into orange (55cm) Pball 15x3'                  PT Short Term Goals - 04/20/15 1422    PT SHORT TERM GOAL #1   Title Independent with initial HEP (05/08/15)   Status Achieved           PT Long Term Goals - 05/09/15 1521    PT LONG TERM GOAL #1   Title Independent with advanced HEP (05/29/15)   Status On-going   PT LONG TERM GOAL #2   Title Patient will report worst hip pain no greater than 5/10 (05/29/15)   Status On-going   PT LONG TERM GOAL #3   Title Patient will demonstrate right hip  strength 4/5 or greater for improved joint stability (05/29/15)   Status On-going   PT LONG TERM GOAL #4   Title Patient will be able to stand 10 minutes or greater without increased right hip pain (05/29/15)   Status On-going   PT LONG TERM GOAL #5   Title Patient will report no sleep disturbance due to right hip pain (05/29/15)   Status On-going               Plan - 05/09/15 1516    Clinical Impression Statement Patient reporting improved pain relief over weekend and presents to therapy without pain today. Tolerated progression of exercises with increasing stability focus with use of BOSU although did experience one self-corrected LOB while performing lateral step-up on BOSU. Patient remains painfreee at end of session, therefore deferred estim and moist heat.   PT Next Visit Plan Cont gradual progression of LE flexibility, and increase in CKC/ functional exercises per pt tolerance, modalities PRN    Consulted and Agree with Plan of Care Patient        Problem List Patient Active Problem List   Diagnosis Date Noted  . Morbid (severe) obesity due to excess calories ([place]) 04/11/2015  . Right hip pain 04/11/2015  . Pericardial effusion   . Dyspnea on exertion 10/20/2014  . Streptococcal sore throat 10/20/2014  . SOB (shortness of breath) 10/20/2014  . Pericarditis 10/20/2014  . Sleep apnea   . Esophageal spasm     Percival Spanish, PT, MPT 05/09/2015, 3:22 PM  [facility] [address]  [place], [place], [address] Phone: [phone]   Fax:  [phone]  Name: Benjamin Lee MRN: [id number] Date of Birth: [birth year]

## 2015-05-11 ENCOUNTER — Ambulatory Visit: Payer: 59 | Admitting: Rehabilitative and Restorative Service Providers"

## 2015-05-11 ENCOUNTER — Encounter: Payer: Self-pay | Admitting: Family Medicine

## 2015-05-11 ENCOUNTER — Ambulatory Visit (INDEPENDENT_AMBULATORY_CARE_PROVIDER_SITE_OTHER): Payer: 59 | Admitting: Family Medicine

## 2015-05-11 VITALS — BP 155/85 | HR 94 | Ht 73.0 in | Wt 335.0 lb

## 2015-05-11 DIAGNOSIS — M25551 Pain in right hip: Secondary | ICD-10-CM

## 2015-05-11 DIAGNOSIS — R29898 Other symptoms and signs involving the musculoskeletal system: Secondary | ICD-10-CM

## 2015-05-11 NOTE — Assessment & Plan Note (Signed)
radiographs with mild DJD only - no evidence AVN, other abnormalities.  Improving with physical therapy, home exercises.  Continue with these.  Ibuprofen as needed.  F/u in 6 weeks or prn.

## 2015-05-11 NOTE — Progress Notes (Signed)
PCP: Franki Monte, MD  Subjective:   HPI: Patient is a 49 y.o. male here for right hip pain.  10/10: Patient denies known injury. States for about a month he has had mild pain in right hip area, deep anteriorly through to the back that is now more intense, 7/10 level at times and sharp. Taking ibuprofen 800mg  with some benefit. Better lying onto the right side or leaning to the right. No back pain, numbness or tingling. Has some pain in right lower leg as well lateral to tibia in midportion of the lower leg. No prior issues with hip. No skin changes, fever, other complaints.  11/10: Patient reports he feels much better. Pain level down to 2/10 deep lateral hip. Doing well with physical therapy, home exercises. Taking ibuprofen. No numbness or tingling. No back pain. Describes pain as a soreness. No skin changes, fever, other complaints.  Past Medical History  Diagnosis Date  . Esophageal spasm     "RX controlled" (10/20/2014)  . OSA on CPAP   . Kidney stones 05/2014    "passed it"    Current Outpatient Prescriptions on File Prior to Visit  Medication Sig Dispense Refill  . cholecalciferol (VITAMIN D) 1000 UNITS tablet Take 1,000 Units by mouth daily.    Marland Kitchen HYDROcodone-acetaminophen (NORCO) 5-325 MG tablet Take 1 tablet by mouth at bedtime as needed for moderate pain. 30 tablet 0  . Multiple Vitamins-Minerals (MULTIVITAMIN WITH MINERALS) tablet Take 1 tablet by mouth daily.    . verapamil (CALAN) 120 MG tablet Take 120 mg by mouth daily.     Marland Kitchen zolpidem (AMBIEN) 10 MG tablet Take 10 mg by mouth at bedtime as needed for sleep.     No current facility-administered medications on file prior to visit.    Past Surgical History  Procedure Laterality Date  . Cardiac catheterization  2011    "completely clean"    No Known Allergies  Social History   Social History  . Marital Status: Married    Spouse Name: N/A  . Number of Children: N/A  . Years of Education: N/A    Occupational History  . Not on file.   Social History Main Topics  . Smoking status: Never Smoker   . Smokeless tobacco: Former Systems developer     Comment: "quit chewing in the 1990's"  . Alcohol Use: 0.0 oz/week    0 Standard drinks or equivalent per week     Comment: 10/20/2014 "might have a few drinks a few times/yr"  . Drug Use: No  . Sexual Activity: Yes   Other Topics Concern  . Not on file   Social History Narrative    Family History  Problem Relation Age of Onset  . Diabetes Mellitus II      Grandmother, mother side  . Liver cancer      Grandmother, father side    BP 155/85 mmHg  Pulse 94  Ht 6\' 1"  (1.854 m)  Wt 335 lb (151.955 kg)  BMI 44.21 kg/m2  Review of Systems: See HPI above.    Objective:  Physical Exam:  Gen: NAD, comfortable in exam room.  Back: No gross deformity, scoliosis. No TTP.  No midline or bony TTP. FROM without pain. Strength LEs 5/5 all muscle groups.   Trace MSRs in patellar and achilles tendons, equal bilaterally. Negative SLRs. Sensation intact to light touch bilaterally.  Right hip: Mild pain with right logroll, negative left. Mild limitation ROM both hips with internal rotation, right worse than left. Negative  fabers and piriformis stretches.  Assessment & Plan:  1. Right hip pain - radiographs with mild DJD only - no evidence AVN, other abnormalities.  Improving with physical therapy, home exercises.  Continue with these.  Ibuprofen as needed.  F/u in 6 weeks or prn.

## 2015-05-11 NOTE — Therapy (Signed)
[name] [address]  [place], [place], [address] Phone: [phone]   Fax:  [phone]  Physical Therapy Treatment  Patient Details  Name: Benjamin Lee MRN: [id number] Date of Birth: Nov 11, 1965 Referring Provider: Dene Gentry, MD  Encounter Date: 05/11/2015      PT End of Session - 05/11/15 1015    Visit Number 8   Number of Visits 12   Date for PT Re-Evaluation 05/29/15   PT Start Time 1012   PT Stop Time 1055   PT Time Calculation (min) 43 min   Activity Tolerance Patient tolerated treatment well   Behavior During Therapy [facility] for tasks assessed/performed      Past Medical History  Diagnosis Date  . Esophageal spasm     "RX controlled" (10/20/2014)  . OSA on CPAP   . Kidney stones 05/2014    "passed it"    Past Surgical History  Procedure Laterality Date  . Cardiac catheterization  2011    "completely clean"    There were no vitals filed for this visit.  Visit Diagnosis:  Right hip pain  Right leg weakness      Subjective Assessment - 05/11/15 1013    Subjective Felt good until this am when he was sore and pain in hip and back. Saw MD this am to cointinue PT for 3 more weeks of PT and does not need to RTD unless he has problems    Currently in Pain? Yes   Pain Score 2    Pain Location Hip   Pain Orientation Right;Lateral   Pain Descriptors / Indicators [facility] Adult PT Treatment/Exercise - 05/11/15 0001    Exercises   Exercises Knee/Hip   Knee/Hip Exercises: Stretches   Passive Hamstring Stretch 30 seconds;2 reps   ITB Stretch 30 seconds;2 reps   Piriformis Stretch 30 seconds;2 reps  supine diagonal positions    Knee/Hip Exercises: Aerobic   Recumbent Bike lvl 4 x 5'   Knee/Hip Exercises: Standing   Hip Flexion Both;10 reps;Knee straight   Hip Flexion Limitations Blue TB, standing on blue foam oval (done in sequence with hip  Abd & Ext) , 2 pole A   Hip Abduction Both;10 reps;Knee straight   Abduction Limitations Blue TB, standing on blue foam oval (done in sequence with hip Flex & Ext) , 2 pole A   Hip Extension Both;10 reps;Knee straight   Extension Limitations Blue TB, standing on blue foam oval (done in sequence with hip Flex & Abd) , 2 pole A   Step Down Both;10 reps;Hand Hold: 2;Step Height: 6"   Step Down Limitations eccentric reach down with heel touch   Functional Squat 15 reps;2 sets   Functional Squat Limitations TRX   Walking with Sports Cord Sidestepping with blue TB bilateral 31ft x 3   Other Standing Knee Exercises fwd touch to mat table with SLS x15   Other Standing Knee Exercises sit to stand stand to sit partial range 10-15 reps    Knee/Hip Exercises: Supine   Bridges with Clamshell Both;Strengthening;15 reps  Black TB                  PT Short Term Goals - 04/20/15 1422    PT SHORT TERM GOAL #1   Title Independent with initial HEP (05/08/15)  Status Achieved           PT Long Term Goals - 05/09/15 1521    PT LONG TERM GOAL #1   Title Independent with advanced HEP (05/29/15)   Status On-going   PT LONG TERM GOAL #2   Title Patient will report worst hip pain no greater than 5/10 (05/29/15)   Status On-going   PT LONG TERM GOAL #3   Title Patient will demonstrate right hip strength 4/5 or greater for improved joint stability (05/29/15)   Status On-going   PT LONG TERM GOAL #4   Title Patient will be able to stand 10 minutes or greater without increased right hip pain (05/29/15)   Status On-going   PT LONG TERM GOAL #5   Title Patient will report no sleep disturbance due to right hip pain (05/29/15)   Status On-going               Plan - 05/11/15 1038    Clinical Impression Statement Some pain today but overall continues to improve. Tight through piriformis with stretches   Pt will benefit from skilled therapeutic intervention in order to improve on the  following deficits Pain;Impaired flexibility;Decreased range of motion;Decreased strength;Decreased activity tolerance;Difficulty walking;Decreased balance   Rehab Potential Good   PT Frequency 2x / week   PT Duration 6 weeks   PT Treatment/Interventions Therapeutic exercise;Passive range of motion;Manual techniques;Therapeutic activities;Gait training;Ultrasound;Moist Heat;Electrical Stimulation;Neuromuscular re-education;Balance training;Patient/family education   PT Next Visit Plan Cont gradual progression of LE flexibility, and increase in CKC/ functional exercises per pt tolerance, modalities PRN   PT Home Exercise Plan Right hip ROM and strengthening, Modailities PRN   Consulted and Agree with Plan of Care Patient        Problem List Patient Active Problem List   Diagnosis Date Noted  . Morbid (severe) obesity due to excess calories ([place]) 04/11/2015  . Right hip pain 04/11/2015  . Pericardial effusion   . Dyspnea on exertion 10/20/2014  . Streptococcal sore throat 10/20/2014  . SOB (shortness of breath) 10/20/2014  . Pericarditis 10/20/2014  . Sleep apnea   . Esophageal spasm     Benjamin Lee PT, MPH 05/11/2015, 10:56 AM  [facility] [address]  [place], [place], [address] Phone: [phone]   Fax:  [phone]  Name: Benjamin Lee MRN: [id number] Date of Birth: Nov 20, 1965

## 2015-05-17 ENCOUNTER — Ambulatory Visit: Payer: 59 | Admitting: Rehabilitative and Restorative Service Providers"

## 2015-05-17 DIAGNOSIS — M25551 Pain in right hip: Secondary | ICD-10-CM | POA: Diagnosis not present

## 2015-05-17 DIAGNOSIS — R29898 Other symptoms and signs involving the musculoskeletal system: Secondary | ICD-10-CM

## 2015-05-17 NOTE — Therapy (Signed)
[facility] [address]  [place], [place], [address] Phone: [phone]   Fax:  [phone]  Physical Therapy Treatment  Patient Details  Name: Benjamin Lee MRN: [id number] Date of Birth: [birth year] Referring Provider: Dene Gentry, MD  Encounter Date: 05/17/2015      PT End of Session - 05/17/15 1724    Visit Number 9   Number of Visits 12   Date for PT Re-Evaluation 05/29/15   PT Start Time [id number]   PT Stop Time 1533   PT Time Calculation (min) 48 min   Activity Tolerance Patient tolerated treatment well      Past Medical History  Diagnosis Date  . Esophageal spasm     "RX controlled" (10/20/2014)  . OSA on CPAP   . Kidney stones 05/2014    "passed it"    Past Surgical History  Procedure Laterality Date  . Cardiac catheterization  2011    "completely clean"    There were no vitals filed for this visit.  Visit Diagnosis:  Right hip pain  Right leg weakness      Subjective Assessment - 05/17/15 1509    Subjective Not a good day today - has good and bad days. Does not know of anything he did differently - just worse somedays. Has been working 16 hr/day... Feels "much better" post treatment with manual work and ice while on myofacial ball Rt hip. Good response noted with improved movement and gait    Currently in Pain? Yes   Pain Score 3    Pain Location Hip   Pain Orientation Right;Lateral   Pain Descriptors / Indicators Sharp   Pain Onset More than a month ago                         [facility] Adult PT Treatment/Exercise - 05/17/15 0001    Therapeutic Activites    Therapeutic Activities --  myofacial ball release Rt piriformis/hip abd supine/standing   Exercises   Exercises Knee/Hip   Knee/Hip Exercises: Stretches   Passive Hamstring Stretch 30 seconds;2 reps   ITB Stretch 30 seconds;2 reps   Piriformis Stretch 30 seconds;2 reps  supine diagonal positions    Knee/Hip  Exercises: Aerobic   Recumbent Bike lvl 4 x 7'   Knee/Hip Exercises: Standing   Lateral Step Up Both;10 reps;Hand Hold: 2   Step Down Both;10 reps;Hand Hold: 2;Step Height: 6"   Step Down Limitations eccentric reach down with heel touch   Functional Squat 15 reps;2 sets   Functional Squat Limitations TRX   Cryotherapy   Number Minutes Cryotherapy 15 Minutes   Cryotherapy Location Hip   Type of Cryotherapy Ice pack   Manual Therapy   Manual therapy comments deep tissue work through Rt hip - piriformis and hip abductors with pt prone - added IR/ER stretch for hip with knee flexed                 PT Education - 05/17/15 1723    Education provided Yes   Education Details instructed in myofacila ball release work to deep tissues of Rt hip - piriformis/hip abductors; use of ice for home    Person(s) Educated Patient   Methods Explanation;Demonstration;Tactile cues;Verbal cues   Comprehension Verbalized understanding;Returned demonstration;Verbal cues required;Tactile cues required          PT Short Term Goals - 04/20/15 1422    PT SHORT TERM GOAL #1  Title Independent with initial HEP (05/08/15)   Status Achieved           PT Long Term Goals - 05/09/15 1521    PT LONG TERM GOAL #1   Title Independent with advanced HEP (05/29/15)   Status On-going   PT LONG TERM GOAL #2   Title Patient will report worst hip pain no greater than 5/10 (05/29/15)   Status On-going   PT LONG TERM GOAL #3   Title Patient will demonstrate right hip strength 4/5 or greater for improved joint stability (05/29/15)   Status On-going   PT LONG TERM GOAL #4   Title Patient will be able to stand 10 minutes or greater without increased right hip pain (05/29/15)   Status On-going   PT LONG TERM GOAL #5   Title Patient will report no sleep disturbance due to right hip pain (05/29/15)   Status On-going               Plan - 05/17/15 1725    Clinical Impression Statement Increased pain  today may be related to increased work hours. Pt exhibited antalgic gait and difficulty performing exercise in the clinic today. Responded well to manual work through [place] - piriformis/abductors and ball release work with prolonged TP work during Medical illustrator. he is working to avoid ER of LE's with functional activities.   Pt will benefit from skilled therapeutic intervention in order to improve on the following deficits Pain;Impaired flexibility;Decreased range of motion;Decreased strength;Decreased activity tolerance;Difficulty walking;Decreased balance   Rehab Potential Good   PT Frequency 2x / week   PT Duration 6 weeks   PT Treatment/Interventions Therapeutic exercise;Passive range of motion;Manual techniques;Therapeutic activities;Gait training;Ultrasound;Moist Heat;Electrical Stimulation;Neuromuscular re-education;Balance training;Patient/family education   PT Next Visit Plan Cont gradual progression of LE flexibility, and increase in CKC/ functional exercises per pt tolerance, modalities PRN   PT Home Exercise Plan Right hip ROM and strengthening, Modailities PRN; trial of myofacial ball release work for home    Consulted and Agree with Plan of Care Patient        Problem List Patient Active Problem List   Diagnosis Date Noted  . Morbid (severe) obesity due to excess calories ([place]) 04/11/2015  . Right hip pain 04/11/2015  . Pericardial effusion   . Dyspnea on exertion 10/20/2014  . Streptococcal sore throat 10/20/2014  . SOB (shortness of breath) 10/20/2014  . Pericarditis 10/20/2014  . Sleep apnea   . Esophageal spasm     [name] Nilda Simmer PT, MPH 05/17/2015, 5:32 PM  [facility] [address]  [place], [place], [address] Phone: [phone]   Fax:  [phone]  Name: Benjamin Lee MRN: [id number] Date of Birth: [birth year]

## 2015-05-23 ENCOUNTER — Ambulatory Visit: Payer: 59 | Admitting: Physical Therapy

## 2015-05-23 DIAGNOSIS — R29898 Other symptoms and signs involving the musculoskeletal system: Secondary | ICD-10-CM

## 2015-05-23 DIAGNOSIS — M25551 Pain in right hip: Secondary | ICD-10-CM | POA: Diagnosis not present

## 2015-05-23 NOTE — Therapy (Signed)
[facility] [address]  [place], [place], [address] Phone: [phone]   Fax:  [phone]  Physical Therapy Treatment  Patient Details  Name: Benjamin Lee MRN: [id number] Date of Birth: 1965-09-28 Referring Provider: Dene Gentry, MD  Encounter Date: 05/23/2015      PT End of Session - 05/23/15 1538    Visit Number 10   Number of Visits 12   Date for PT Re-Evaluation 06/09/15   PT Start Time 1532   PT Stop Time 1611   PT Time Calculation (min) 39 min   Activity Tolerance Patient tolerated treatment well   Behavior During Therapy [facility] for tasks assessed/performed      Past Medical History  Diagnosis Date  . Esophageal spasm     "RX controlled" (10/20/2014)  . OSA on CPAP   . Kidney stones 05/2014    "passed it"    Past Surgical History  Procedure Laterality Date  . Cardiac catheterization  2011    "completely clean"    There were no vitals filed for this visit.  Visit Diagnosis:  Right hip pain  Right leg weakness      Subjective Assessment - 05/23/15 1536    Subjective Patient denies pain at present. States was sore this morning but relieved with Ibuprofen. Patient states when hip is hurting, stopping and doing his streches often helps lessen the pain.   How long can you stand comfortably? variable   Currently in Pain? No/denies   Pain Score --  Least 0/10, Avg 4/10, Worst 8/10            [facility] PT Assessment - 05/23/15 1532    Assessment   Next MD Visit as needed   Observation/Other Assessments   Focus on Therapeutic Outcomes (FOTO)  Hip 67% (33% limitatio)   ROM / Strength   AROM / PROM / Strength Strength   Strength   Strength Assessment Site Hip   Right/Left Hip Right;Left   Right Hip Flexion 4-/5  pain with resistance   Right Hip Extension 4/5   Right Hip ABduction 4+/5   Right Hip ADduction 4-/5   Left Hip Flexion 4+/5   Left Hip Extension 4+/5   Left Hip  ABduction 4+/5   Left Hip ADduction 4+/5                     [facility] Adult PT Treatment/Exercise - 05/23/15 1532    Exercises   Exercises Knee/Hip   Knee/Hip Exercises: Stretches   Passive Hamstring Stretch 30 seconds;2 reps   ITB Stretch 30 seconds;2 reps   Piriformis Stretch 30 seconds;2 reps  supine diagonal positions    Knee/Hip Exercises: Aerobic   Recumbent Bike lvl 4 x 5'   Knee/Hip Exercises: Standing   Side Lunges Both;1 set;15 reps;3 seconds   Side Lunges Limitations alternating with TRX   Functional Squat 15 reps;2 sets   Functional Squat Limitations TRX with heel raise on return to stand   Other Standing Knee Exercises eccentric lowering with partial stand to sit with legs against edge of plinth x15                  PT Short Term Goals - 04/20/15 1422    PT SHORT TERM GOAL #1   Title Independent with initial HEP (05/08/15)   Status Achieved           PT Long Term Goals - 05/23/15 1603  PT LONG TERM GOAL #1   Title Independent with advanced HEP (05/29/15)   Status On-going   PT LONG TERM GOAL #2   Title Patient will report worst hip pain no greater than 5/10 (05/29/15)   Status On-going  Up to 8/10   PT LONG TERM GOAL #3   Title Patient will demonstrate right hip strength 4/5 or greater for improved joint stability (05/29/15)   Status Partially Met  Met for abduction & extension, 4-/5 for flexion (pain limited) & adduction    PT LONG TERM GOAL #4   Title Patient will be able to stand 10 minutes or greater without increased right hip pain (05/29/15)   Status On-going  Typically able to stand >10 min but variable with less frequent episodes where pain limits to <10 minutes per patient   PT LONG TERM GOAL #5   Title Patient will report no sleep disturbance due to right hip pain (05/29/15)   Status Partially Met  Patient stating much less frequent sleep disturbance, typically only 1-2 nights/wk and able to [facility] with [place]                Plan - 05/23/15 1824    Clinical Impression Statement Patient reporting overall improvement with less frequency of pain interference during sleep and daily activities but still experiences seemingly random episodes of more intense pain. Patient able to mitigate these episodes if performs stretches when pain presents. Patient has demonstrated progress toward all goals with some partially met and remaining limited by variable pain. Reviewed status and progress with patient in relation to proximity to end of current POC, with options of recert vs transitionto home program discussed. Patient would prefer to try HEP focus therefore will plan to upgrade HEP over remaining visits and then place patient on hold x 30 days to see how he fairs on his own.   Pt will benefit from skilled therapeutic intervention in order to improve on the following deficits Pain;Impaired flexibility;Decreased range of motion;Decreased strength;Decreased activity tolerance;Difficulty walking;Decreased balance   PT Treatment/Interventions Therapeutic exercise;Passive range of motion;Manual techniques;Therapeutic activities;Gait training;Ultrasound;Moist Heat;Electrical Stimulation;Neuromuscular re-education;Balance training;Patient/family education   PT Next Visit Plan HEP update in preparation for transition to home program, Manual therapy & modalities PRN   Consulted and Agree with Plan of Care Patient        Problem List Patient Active Problem List   Diagnosis Date Noted  . Morbid (severe) obesity due to excess calories ([place]) 04/11/2015  . Right hip pain 04/11/2015  . Pericardial effusion   . Dyspnea on exertion 10/20/2014  . Streptococcal sore throat 10/20/2014  . SOB (shortness of breath) 10/20/2014  . Pericarditis 10/20/2014  . Sleep apnea   . Esophageal spasm     Percival Spanish, PT, MPT 05/23/2015, 6:38 PM  [facility] [address]   [place], [place], [address] Phone: [phone]   Fax:  [phone]  Name: Benjamin Lee MRN: [id number] Date of Birth: February 25, 1966

## 2015-05-31 ENCOUNTER — Ambulatory Visit: Payer: 59 | Admitting: Physical Therapy

## 2015-05-31 DIAGNOSIS — M25551 Pain in right hip: Secondary | ICD-10-CM | POA: Diagnosis not present

## 2015-05-31 DIAGNOSIS — R29898 Other symptoms and signs involving the musculoskeletal system: Secondary | ICD-10-CM

## 2015-05-31 NOTE — Therapy (Signed)
[facility] [address]  [place], [place], [address] Phone: [phone]   Fax:  [phone]  Physical Therapy Treatment  Patient Details  Name: Benjamin Lee MRN: [id number] Date of Birth: 1965-09-11 Referring Provider: Dene Gentry, MD  Encounter Date: 05/31/2015      PT End of Session - 05/31/15 0856    Visit Number 11   Number of Visits 12   Date for PT Re-Evaluation 06/09/15   PT Start Time 0846   PT Stop Time 0922   PT Time Calculation (min) 36 min   Activity Tolerance Patient tolerated treatment well   Behavior During Therapy [facility] for tasks assessed/performed      Past Medical History  Diagnosis Date  . Esophageal spasm     "RX controlled" (10/20/2014)  . OSA on CPAP   . Kidney stones 05/2014    "passed it"    Past Surgical History  Procedure Laterality Date  . Cardiac catheterization  2011    "completely clean"    There were no vitals filed for this visit.  Visit Diagnosis:  Right hip pain  Right leg weakness      Subjective Assessment - 05/31/15 0851    Subjective Patient denies pain at present but states still has some pain that "comes and goes" without known trigger. Pain typically "not too bad (1/10) but maybe 1x/day gets up to 6/10".  Able to relieve pain quickly if can change position or stretch. Able to stand for >1 hour without pain as long as able to shift weight or move around some.   How long can you stand comfortably? >1 hr   Currently in Pain? No/denies   Pain Score --  Least 0/10, Avg 1/10, Worst 6/10                 [facility] Adult PT Treatment/Exercise - 05/31/15 0846    Exercises   Exercises Knee/Hip   Knee/Hip Exercises: Stretches   Hip Flexor Stretch 30 seconds;Right;3 reps   Hip Flexor Stretch Limitations 1/2 kneel   Knee/Hip Exercises: Aerobic   Recumbent Bike interval lvl 3-5 x 5'   Knee/Hip Exercises: Standing   Hip Flexion Both;15 reps;Knee  straight   Hip Flexion Limitations Blue TB, standing on blue foam oval (done in sequence with hip Abd & Ext) , 2 pole A   Hip Abduction Both;15 reps;Knee straight   Abduction Limitations Blue TB, standing on blue foam oval (done in sequence with hip Flex & Ext) , 2 pole A   Hip Extension Both;15 reps;Knee straight   Extension Limitations Blue TB, standing on blue foam oval (done in sequence with hip Flex & Abd) , 2 pole A   Step Down Both;10 reps;Hand Hold: 2;Step Height: 6"   Step Down Limitations eccentric reach down with heel touch   Functional Squat 15 reps;2 sets   Functional Squat Limitations HHA on edge of counter   Walking with Sports Cord Sidestepping & Monster walk fwd with blue TB bilateral 77f x2   Other Standing Knee Exercises eccentric lowering with partial stand to sit with legs against edge of plinth x15                PT Education - 05/31/15 0923    Education provided Yes   Education Details HEP update   Person(s) Educated Patient   Methods Explanation;Demonstration;Handout   Comprehension Verbalized understanding;Returned demonstration  PT Short Term Goals - 04/20/15 1422    PT SHORT TERM GOAL #1   Title Independent with initial HEP (05/08/15)   Status Achieved           PT Long Term Goals - 05/31/15 0931    PT LONG TERM GOAL #1   Title Independent with advanced HEP (05/29/15)   Status On-going   PT LONG TERM GOAL #2   Title Patient will report worst hip pain no greater than 5/10 (05/29/15)   Status On-going  worst pain typically 6/10   PT LONG TERM GOAL #3   Title Patient will demonstrate right hip strength 4/5 or greater for improved joint stability (05/29/15)   Status Partially Met   PT LONG TERM GOAL #4   Title Patient will be able to stand 10 minutes or greater without increased right hip pain (05/29/15)   Status Achieved   PT LONG TERM GOAL #5   Title Patient will report no sleep disturbance due to right hip pain (05/29/15)    Status Partially Met               Plan - 05/31/15 0923    Clinical Impression Statement Patient reporting pain overall improved with decreased frequency and intensity but still reporting occasional mild pain (1/10) with less frequent more intense pain (up to 6/10) but typically able to resolve pain with change of position or stretching. Reporting continued compliance with existing HEP with exception of hip flexor stretch secondary to feels like he will fall off the edge of the bed, therefore instructed in alternative method to achieve stretch. Updated HEP with instructions for continued progression at home. WIll follow at next visit and if no problems with updated HEP or new issues identified, will plan to proceed with discharge.   PT Next Visit Plan Review of updated HEP, probable discharge   Consulted and Agree with Plan of Care Patient        Problem List Patient Active Problem List   Diagnosis Date Noted  . Morbid (severe) obesity due to excess calories ([place]) 04/11/2015  . Right hip pain 04/11/2015  . Pericardial effusion   . Dyspnea on exertion 10/20/2014  . Streptococcal sore throat 10/20/2014  . SOB (shortness of breath) 10/20/2014  . Pericarditis 10/20/2014  . Sleep apnea   . Esophageal spasm     Percival Spanish, PT, MPT 05/31/2015, 9:34 AM  [facility] [address]  [place], [place], [address] Phone: [phone]   Fax:  [phone]  Name: Benjamin Lee MRN: [id number] Date of Birth: [birth year]

## 2015-06-09 ENCOUNTER — Ambulatory Visit: Payer: 59 | Attending: Family Medicine | Admitting: Physical Therapy

## 2015-06-09 DIAGNOSIS — R29898 Other symptoms and signs involving the musculoskeletal system: Secondary | ICD-10-CM | POA: Diagnosis present

## 2015-06-09 DIAGNOSIS — M25551 Pain in right hip: Secondary | ICD-10-CM | POA: Diagnosis present

## 2015-06-09 NOTE — Therapy (Signed)
[facility] [address]  [place], [place], [address] Phone: [phone]   Fax:  [phone]  Physical Therapy Treatment  Patient Details  Name: Benjamin Lee MRN: [id number] Date of Birth: May 15, 1966 Referring Provider: Dene Gentry, MD  Encounter Date: 06/09/2015      PT End of Session - 06/09/15 0847    Visit Number 12   Number of Visits 12   PT Start Time 0841   PT Stop Time 0902   PT Time Calculation (min) 21 min   Activity Tolerance Patient tolerated treatment well   Behavior During Therapy [facility] for tasks assessed/performed      Past Medical History  Diagnosis Date  . Esophageal spasm     "RX controlled" (10/20/2014)  . OSA on CPAP   . Kidney stones 05/2014    "passed it"    Past Surgical History  Procedure Laterality Date  . Cardiac catheterization  2011    "completely clean"    There were no vitals filed for this visit.  Visit Diagnosis:  Right hip pain  Right leg weakness      Subjective Assessment - 06/09/15 0844    Subjective Patient stating most of the time he is painfree. Hurting a little this morning but thinks this is due to crawling around under his car yesterday "[place]". Feels comfortable with transitioning to HEP.   How long can you stand comfortably? No limit if able to shift weight or change positions   How long can you walk comfortably? No restriction related to hip   Currently in Pain? Yes   Pain Score 1   Least 0/10, Worst 4/10   Pain Location Hip   Pain Orientation Right            [facility] PT Assessment - 06/09/15 0841    Assessment   Medical Diagnosis Rt hip pain   Observation/Other Assessments   Focus on Therapeutic Outcomes (FOTO)  Hip 98% (2% limitatio)   ROM / Strength   AROM / PROM / Strength Strength   Strength   Strength Assessment Site Hip   Right/Left Hip Right;Left   Right Hip Flexion 5/5   Right Hip Extension 4+/5   Right Hip  ABduction 5/5   Right Hip ADduction 4+/5   Left Hip Flexion 5/5   Left Hip Extension 4+/5   Left Hip ABduction 5/5   Left Hip ADduction 4+/5                     [facility] Adult PT Treatment/Exercise - 06/09/15 0841    Exercises   Exercises Knee/Hip   Knee/Hip Exercises: Aerobic   Recumbent Bike interval lvl 3-5 x 5'                  PT Short Term Goals - 04/20/15 1422    PT SHORT TERM GOAL #1   Title Independent with initial HEP (05/08/15)   Status Achieved           PT Long Term Goals - 06/09/15 5102    PT LONG TERM GOAL #1   Title Independent with advanced HEP (05/29/15)   Status Achieved   PT LONG TERM GOAL #2   Title Patient will report worst hip pain no greater than 5/10 (05/29/15)   Status Achieved   PT LONG TERM GOAL #3   Title Patient will demonstrate right hip strength 4/5 or greater for improved joint stability (05/29/15)  Status Achieved   PT LONG TERM GOAL #4   Title Patient will be able to stand 10 minutes or greater without increased right hip pain (05/29/15)   Status Achieved   PT LONG TERM GOAL #5   Title Patient will report no sleep disturbance due to right hip pain (05/29/15)   Status Achieved               Plan - 06/09/15 0902    Clinical Impression Statement Patient painfree majority of the time and typically able to mitigate pain with stretching or change of position when right hip pain is present. Pain no longer interfering with daily activities such as sleeping, standing or walking. Right hip strength 4+/5 to 5/5 and symmetrical to left. Patient is independent with HEP and denies need for further review. Instructions provided in progression of exercises as needed including progression of repetitions and resistance. All goals met and no further skilled needs identified. Patient and PT in agreement with discharge from PT.   PT Next Visit Plan Discharge   Consulted and Agree with Plan of Care Patient        Problem  List Patient Active Problem List   Diagnosis Date Noted  . Morbid (severe) obesity due to excess calories ([place]) 04/11/2015  . Right hip pain 04/11/2015  . Pericardial effusion   . Dyspnea on exertion 10/20/2014  . Streptococcal sore throat 10/20/2014  . SOB (shortness of breath) 10/20/2014  . Pericarditis 10/20/2014  . Sleep apnea   . Esophageal spasm     Percival Spanish, PT, MPT 06/09/2015, 9:38 AM  [facility] [address]  [place], [place], [address] Phone: [phone]   Fax:  [phone]  Name: Benjamin Lee MRN: [id number] Date of Birth: Oct 05, 1965   PHYSICAL THERAPY DISCHARGE SUMMARY  Visits from Start of Care: 12  Current functional level related to goals / functional outcomes:   Patient painfree majority of the time and typically able to mitigate pain with stretching or change of position when right hip pain is present. Pain no longer interfering with daily activities such as sleeping, standing or walking. Right hip strength 4+/5 to 5/5 and symmetrical to left. Patient is independent with HEP and denies need for further review. Instructions provided in progression of exercises as needed including progression of repetitions and resistance. All goals met and no further skilled needs identified. Patient and PT in agreement with discharge from PT.   Remaining deficits:   None at present    Education / Equipment:   HEP  Plan: Patient agrees to discharge.  Patient goals were met. Patient is being discharged due to meeting the stated rehab goals.  ?????       Percival Spanish, PT, MPT 06/09/2015, 9:38 AM  [facility] [place], [place], [address] Phone: [phone]   Fax:  [phone]

## 2015-10-26 DIAGNOSIS — J189 Pneumonia, unspecified organism: Secondary | ICD-10-CM | POA: Diagnosis not present

## 2015-10-28 DIAGNOSIS — J189 Pneumonia, unspecified organism: Secondary | ICD-10-CM | POA: Diagnosis not present

## 2015-10-28 DIAGNOSIS — R05 Cough: Secondary | ICD-10-CM | POA: Diagnosis not present

## 2015-11-02 DIAGNOSIS — H40013 Open angle with borderline findings, low risk, bilateral: Secondary | ICD-10-CM | POA: Diagnosis not present

## 2015-11-03 DIAGNOSIS — J189 Pneumonia, unspecified organism: Secondary | ICD-10-CM | POA: Diagnosis not present

## 2015-11-03 DIAGNOSIS — R05 Cough: Secondary | ICD-10-CM | POA: Diagnosis not present

## 2016-11-11 IMAGING — CT CT ABD-PELV W/ CM
2 of 5 series · 17 of 46 positions shown, 19 images · IV contrast (omnipaque)
Comparison: None.

CLINICAL DATA: Right flank and abdominal pain.

EXAM:
CT ABDOMEN AND PELVIS WITH CONTRAST
TECHNIQUE: Multidetector CT imaging of the abdomen and pelvis was performed
using the standard protocol following bolus administration of
intravenous contrast.
CONTRAST:  25mL OMNIPAQUE IOHEXOL 300 MG/ML SOLN, 100mL OMNIPAQUE
IOHEXOL 300 MG/ML SOLN

[Series 2: abd/pelvis 5.0 b31f · axial · 0.98mm/px · z∈[-342,+108]mm · 14 of 102 slices shown, 16 images]
[im 6/102  soft-tissue]
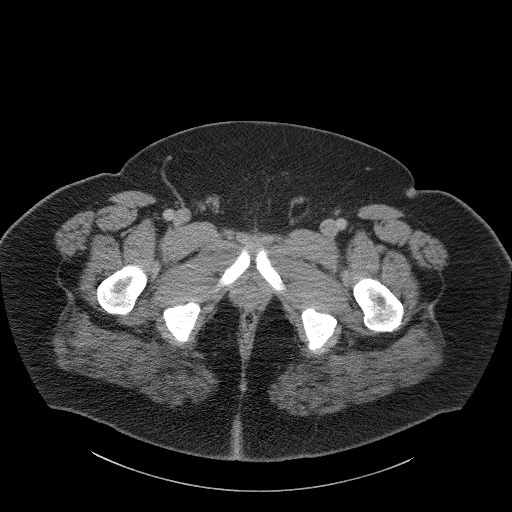
[im 6/102  bone]
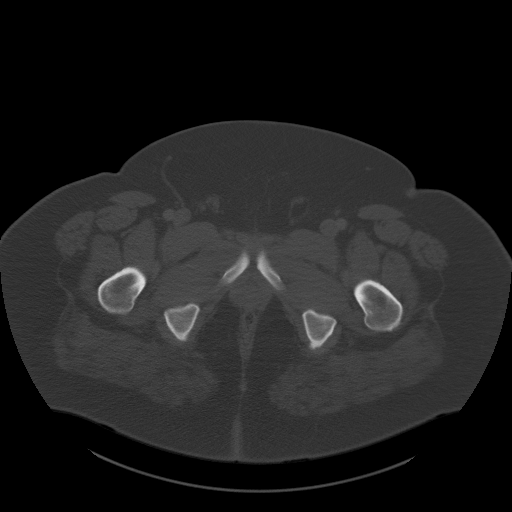
[im 12/102  soft-tissue]
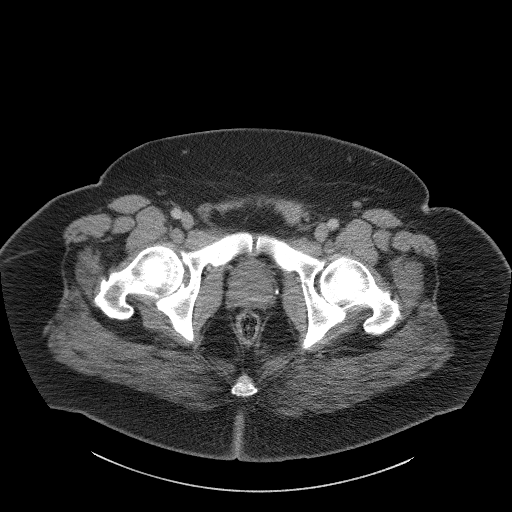
[im 18/102  soft-tissue]
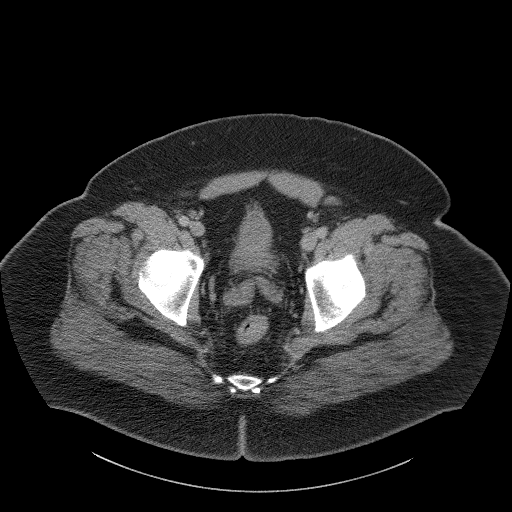
[im 30/102  soft-tissue]
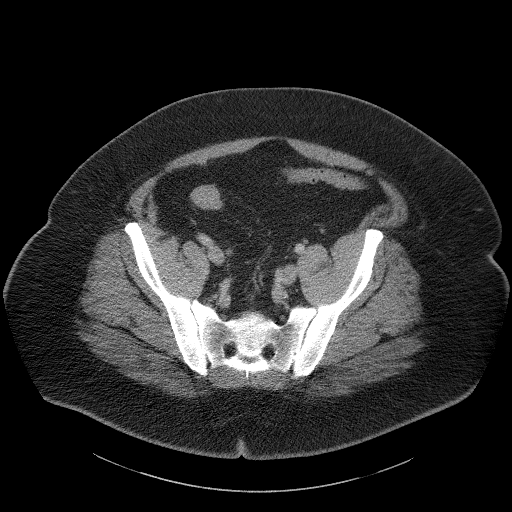
[im 36/102  soft-tissue]
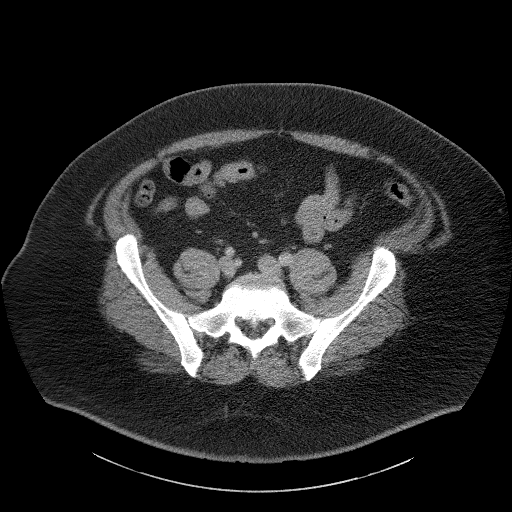
[im 42/102  soft-tissue]
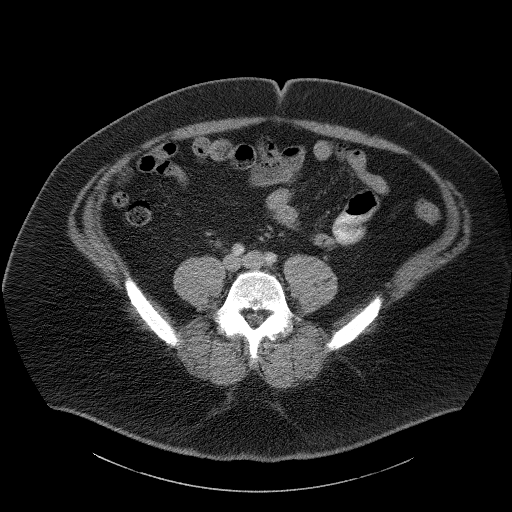
[im 48/102  soft-tissue]
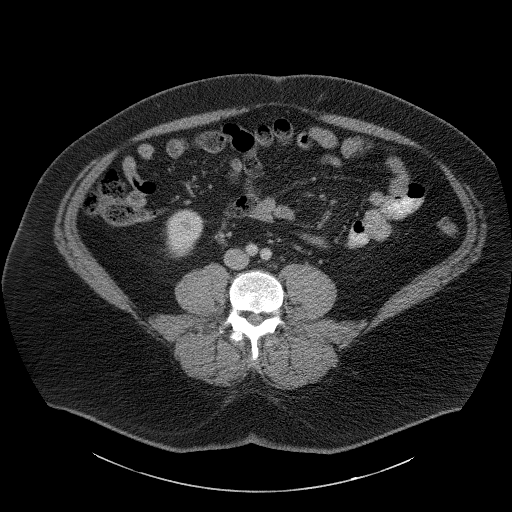
[im 54/102  soft-tissue]
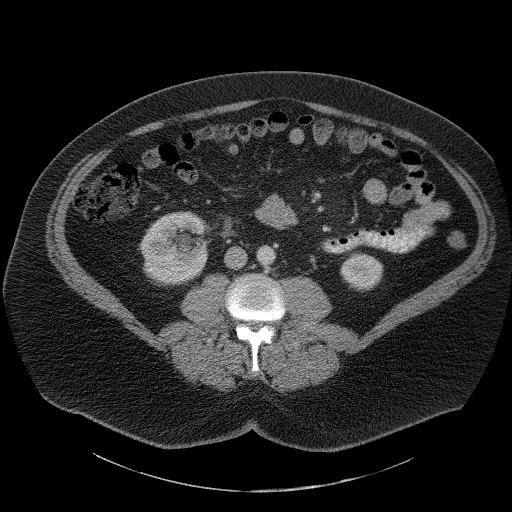
[im 60/102  soft-tissue]
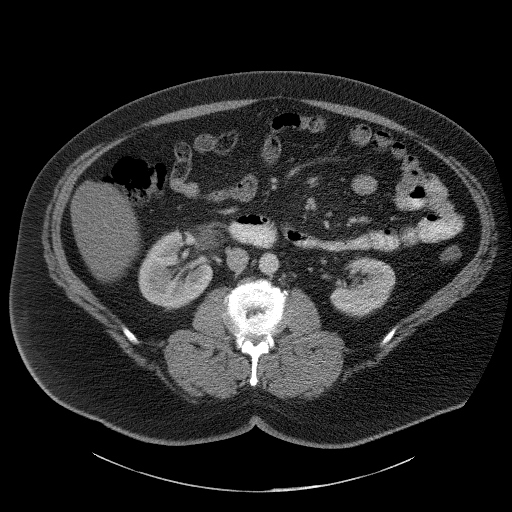
[im 60/102  bone]
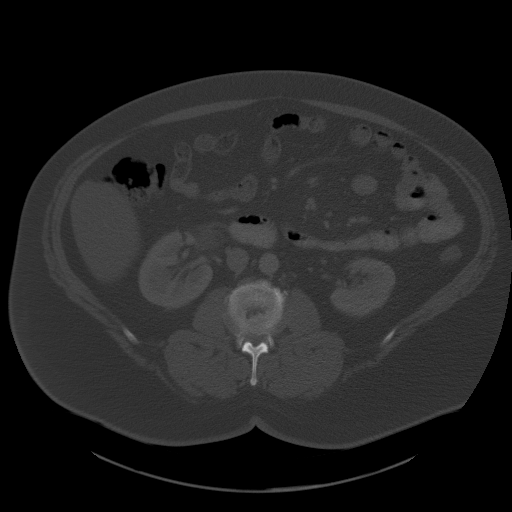
[im 66/102  soft-tissue]
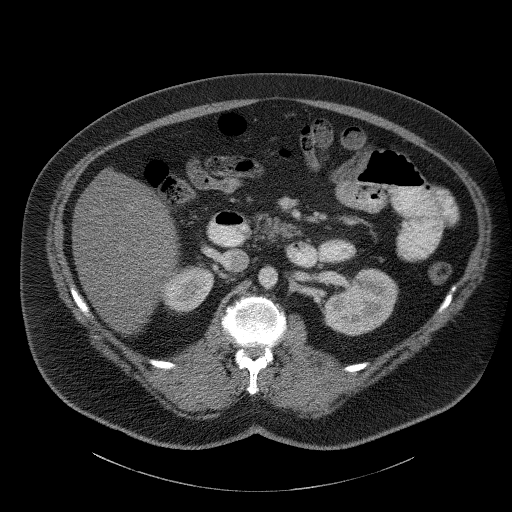
[im 78/102  soft-tissue]
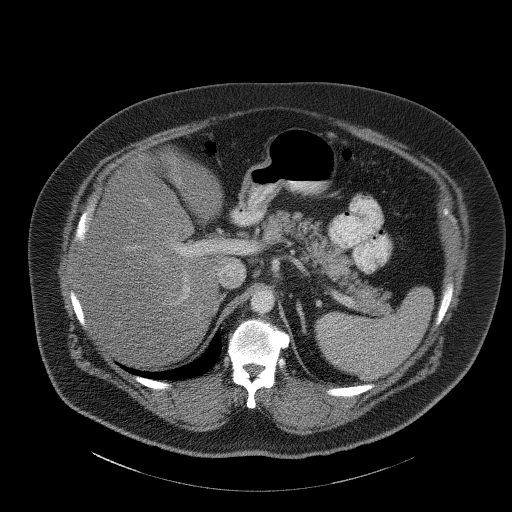
[im 84/102  soft-tissue]
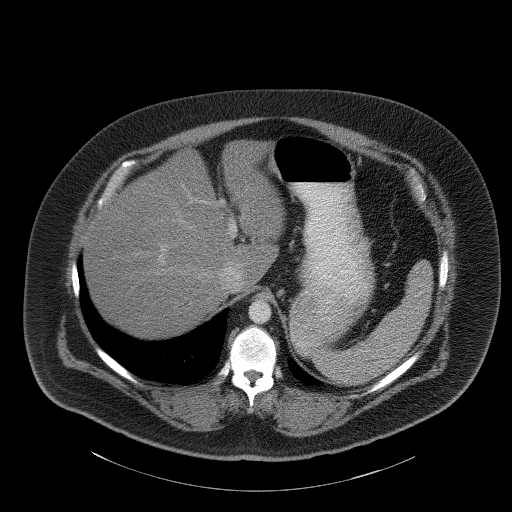
[im 90/102  soft-tissue]
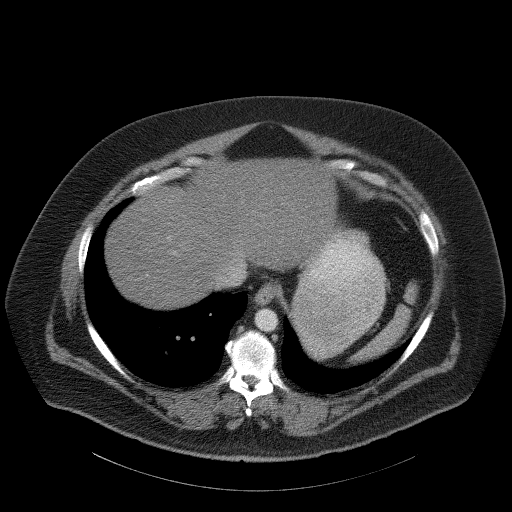
[im 96/102  soft-tissue]
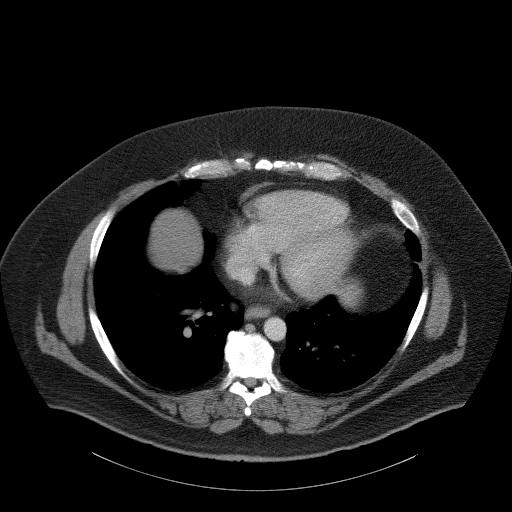

[Series 5: abd/pelvis 3.0 coronal · coronal · 1.02mm/px · 3 of 120 slices shown]
[im 40/120  soft-tissue]
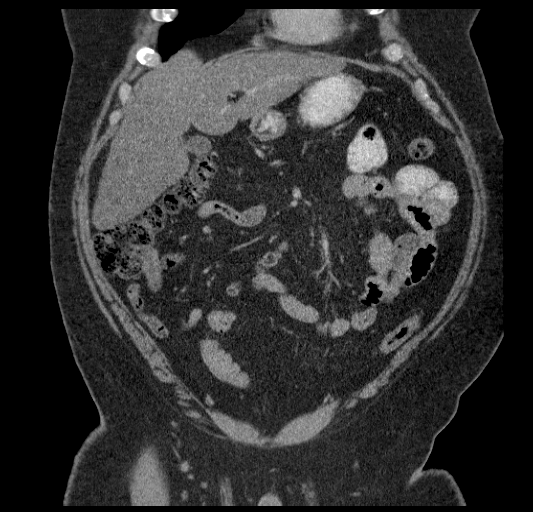
[im 53/120  soft-tissue]
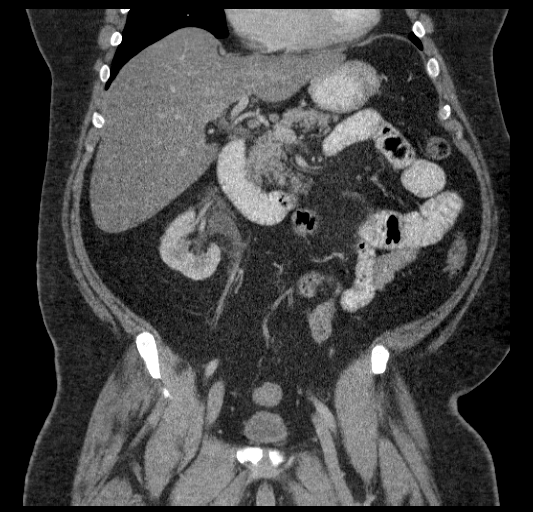
[im 67/120  soft-tissue]
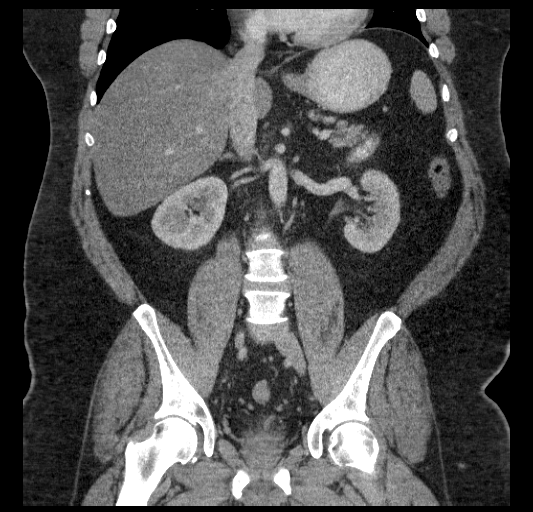

[17 of 46 positions shown; findings below may reference images not displayed]

FINDINGS: BODY WALL: Unremarkable.

LOWER CHEST: Unremarkable.

ABDOMEN/PELVIS:

Liver: Diffuse fatty infiltration.

Biliary: No evidence of biliary obstruction or stone.

Pancreas: Unremarkable.

Spleen: Unremarkable.

Adrenals: Unremarkable.

Kidneys and ureters: There is a 4 mm stone in the distal right
ureter with periureteral edema and mild hydronephrosis. No
additional urolithiasis identified.

Bladder: Unremarkable.

Reproductive: Unremarkable.

Bowel: No obstruction. Normal appendix.

Retroperitoneum: No mass or adenopathy.

Peritoneum: No ascites or pneumoperitoneum.

Vascular: No acute abnormality.

OSSEOUS: No acute abnormalities.
IMPRESSION: 1. 4 mm stone in the distal right ureter with mild obstructive
uropathy.
2. Hepatic steatosis.

## 2016-11-27 DIAGNOSIS — E663 Overweight: Secondary | ICD-10-CM | POA: Diagnosis not present

## 2016-11-27 DIAGNOSIS — N400 Enlarged prostate without lower urinary tract symptoms: Secondary | ICD-10-CM | POA: Diagnosis not present

## 2016-11-27 DIAGNOSIS — H538 Other visual disturbances: Secondary | ICD-10-CM | POA: Diagnosis not present

## 2016-11-27 DIAGNOSIS — R739 Hyperglycemia, unspecified: Secondary | ICD-10-CM | POA: Diagnosis not present

## 2016-12-24 DIAGNOSIS — L237 Allergic contact dermatitis due to plants, except food: Secondary | ICD-10-CM | POA: Diagnosis not present

## 2016-12-24 DIAGNOSIS — R7302 Impaired glucose tolerance (oral): Secondary | ICD-10-CM | POA: Diagnosis not present

## 2016-12-24 DIAGNOSIS — E663 Overweight: Secondary | ICD-10-CM | POA: Diagnosis not present

## 2017-01-14 DIAGNOSIS — Z9989 Dependence on other enabling machines and devices: Secondary | ICD-10-CM | POA: Diagnosis not present

## 2017-01-14 DIAGNOSIS — R5383 Other fatigue: Secondary | ICD-10-CM | POA: Diagnosis not present

## 2017-01-14 DIAGNOSIS — Z6841 Body Mass Index (BMI) 40.0 and over, adult: Secondary | ICD-10-CM | POA: Diagnosis not present

## 2017-01-14 DIAGNOSIS — G4733 Obstructive sleep apnea (adult) (pediatric): Secondary | ICD-10-CM | POA: Diagnosis not present

## 2017-01-14 DIAGNOSIS — R7302 Impaired glucose tolerance (oral): Secondary | ICD-10-CM | POA: Diagnosis not present

## 2017-01-14 MED FILL — ZOLPIDEM TARTRATE 10 MG TAB: 10 | 90 days supply | Qty: 90 | Fill #0

## 2017-01-21 ENCOUNTER — Telehealth: Payer: 59 | Admitting: Family

## 2017-01-21 DIAGNOSIS — M5440 Lumbago with sciatica, unspecified side: Secondary | ICD-10-CM

## 2017-01-21 MED ORDER — CYCLOBENZAPRINE HCL 10 MG PO TABS: 10.0000 mg | ORAL_TABLET | Freq: Three times a day (TID) | ORAL | 0 refills | Status: DC | PRN

## 2017-01-21 MED ORDER — NAPROXEN 500 MG PO TABS: 500.0000 mg | ORAL_TABLET | Freq: Two times a day (BID) | ORAL | 0 refills | Status: DC

## 2017-01-21 MED FILL — FLUoxetine HCL 20 MG CAPS: 20 | 30 days supply | Qty: 60 | Fill #0

## 2017-01-21 MED FILL — VERAPAMIL 120 MG TABLET: 120 | 30 days supply | Qty: 30 | Fill #0

## 2017-01-21 NOTE — Progress Notes (Signed)

## 2017-02-27 MED FILL — FLUoxetine HCL 20 MG CAPS: 20 | 30 days supply | Qty: 60 | Fill #1

## 2017-02-27 MED FILL — VERAPAMIL 120 MG TABLET: 120 | 30 days supply | Qty: 30 | Fill #1

## 2017-03-24 ENCOUNTER — Ambulatory Visit (INDEPENDENT_AMBULATORY_CARE_PROVIDER_SITE_OTHER): Payer: 59 | Admitting: Family Medicine

## 2017-03-24 ENCOUNTER — Ambulatory Visit (HOSPITAL_BASED_OUTPATIENT_CLINIC_OR_DEPARTMENT_OTHER)
Admission: RE | Admit: 2017-03-24 | Discharge: 2017-03-24 | Disposition: A | Discharge status: Home / Self Care | Payer: 59 | Source: Ambulatory Visit | Attending: Family Medicine | Admitting: Family Medicine

## 2017-03-24 ENCOUNTER — Encounter: Payer: Self-pay | Admitting: Family Medicine

## 2017-03-24 VITALS — BP 131/81 | HR 90 | Ht 73.0 in | Wt 305.0 lb

## 2017-03-24 DIAGNOSIS — S6991XA Unspecified injury of right wrist, hand and finger(s), initial encounter: Secondary | ICD-10-CM | POA: Insufficient documentation

## 2017-03-24 DIAGNOSIS — X58XXXA Exposure to other specified factors, initial encounter: Secondary | ICD-10-CM | POA: Insufficient documentation

## 2017-03-24 DIAGNOSIS — M79641 Pain in right hand: Secondary | ICD-10-CM | POA: Diagnosis not present

## 2017-03-24 DIAGNOSIS — M25531 Pain in right wrist: Secondary | ICD-10-CM | POA: Diagnosis not present

## 2017-03-25 DIAGNOSIS — S6991XA Unspecified injury of right wrist, hand and finger(s), initial encounter: Secondary | ICD-10-CM | POA: Insufficient documentation

## 2017-03-25 NOTE — Assessment & Plan Note (Signed)
ordered and independently reviewed radiographs - no evidence fracture.  Exam otherwise reassuring, no snuffbox tenderness to warrant concern for occult fracture or scapholunate dissociation.  Discussed icing, tylenol or ibuprofen.  Consider wrist brace.  F/u prn.

## 2017-03-25 NOTE — Progress Notes (Signed)
PCP: Franki Monte, MD  Subjective:   HPI: Patient is a 51 y.o. male here for right hand injury.  Patient reports on 9/22 he accidentally had foot catch the edge of a driveway and fell down. Sustained a [place] injury to right hand/wrist with pain, swelling dorsally. Associated bruising as well. Has been icing, taking ibuprofen, and elevating. Pain level is 3/10 currently but can get up to 7/10 and sharp. Has abrasion to anterior right knee but this is ok. He is right handed.  Past Medical History:  Diagnosis Date  . Esophageal spasm    "RX controlled" (10/20/2014)  . Kidney stones 05/2014   "passed it"  . OSA on CPAP     Current Outpatient Prescriptions on File Prior to Visit  Medication Sig Dispense Refill  . cholecalciferol (VITAMIN D) 1000 UNITS tablet Take 1,000 Units by mouth daily.    . cyclobenzaprine (FLEXERIL) 10 MG tablet Take 1 tablet (10 mg total) by mouth 3 (three) times daily as needed for muscle spasms. 30 tablet 0  . HYDROcodone-acetaminophen (NORCO) 5-325 MG tablet Take 1 tablet by mouth at bedtime as needed for moderate pain. 30 tablet 0  . Multiple Vitamins-Minerals (MULTIVITAMIN WITH MINERALS) tablet Take 1 tablet by mouth daily.    . naproxen (NAPROSYN) 500 MG tablet Take 1 tablet (500 mg total) by mouth 2 (two) times daily with a meal. 30 tablet 0  . verapamil (CALAN) 120 MG tablet Take 120 mg by mouth daily.     Marland Kitchen zolpidem (AMBIEN) 10 MG tablet Take 10 mg by mouth at bedtime as needed for sleep.     No current facility-administered medications on file prior to visit.     Past Surgical History:  Procedure Laterality Date  . CARDIAC CATHETERIZATION  2011   "completely clean"    No Known Allergies  Social History   Social History  . Marital status: Married    Spouse name: N/A  . Number of children: N/A  . Years of education: N/A   Occupational History  . Not on file.   Social History Main Topics  . Smoking status: Never Smoker  . Smokeless  tobacco: Former Systems developer     Comment: "quit chewing in the 1990's"  . Alcohol use 0.0 oz/week     Comment: 10/20/2014 "might have a few drinks a few times/yr"  . Drug use: No  . Sexual activity: Yes   Other Topics Concern  . Not on file   Social History Narrative  . No narrative on file    Family History  Problem Relation Age of Onset  . Diabetes Mellitus II Unknown        Grandmother, mother side  . Liver cancer Unknown        Grandmother, father side    BP 131/81   Pulse 90   Ht 6\' 1"  (1.854 m)   Wt (!) 305 lb (138.3 kg)   BMI 40.24 kg/m   Review of Systems: See HPI above.     Objective:  Physical Exam:  Gen: NAD, comfortable in exam room  Right hand/wrist: Mild swelling but no bruising dorsal wrist and hand.  No malrotation or angulation of digits.  TTP greatest ulnar sided distal carpal row, 4th, 5th metatcarpals.  No snuffbox or other tenderness. FROM digits.  Mild limitation ROM wrist with flexion and extension. Sensation diminished to light touch.  Left hand/wrist: FROM without pain.   Assessment & Plan:  1. Right hand injury - ordered and independently  reviewed radiographs - no evidence fracture.  Exam otherwise reassuring, no snuffbox tenderness to warrant concern for occult fracture or scapholunate dissociation.  Discussed icing, tylenol or ibuprofen.  Consider wrist brace.  F/u prn.

## 2017-04-03 MED FILL — FLUoxetine HCL 20 MG CAPS: 20 | 30 days supply | Qty: 60 | Fill #2

## 2017-04-03 MED FILL — VERAPAMIL 120 MG TABLET: 120 | 30 days supply | Qty: 30 | Fill #2

## 2017-05-12 MED FILL — VERAPAMIL 120 MG TABLET: 120 | 30 days supply | Qty: 30 | Fill #3

## 2017-05-12 MED FILL — FLUoxetine HCL 20 MG CAPS: 20 | 30 days supply | Qty: 60 | Fill #3

## 2017-06-12 MED FILL — VERAPAMIL 120 MG TABLET: 120 | 30 days supply | Qty: 30 | Fill #4

## 2017-07-04 ENCOUNTER — Telehealth: Payer: 59 | Admitting: Family

## 2017-07-04 DIAGNOSIS — M549 Dorsalgia, unspecified: Secondary | ICD-10-CM

## 2017-07-04 MED ORDER — BACLOFEN 10 MG PO TABS: 10.0000 mg | ORAL_TABLET | Freq: Three times a day (TID) | ORAL | 0 refills | Status: DC | PRN

## 2017-07-04 MED ORDER — PREDNISONE 5 MG PO TABS: 5.0000 mg | ORAL_TABLET | ORAL | 0 refills | Status: DC

## 2017-07-04 MED ORDER — ETODOLAC 300 MG PO CAPS: 300.0000 mg | ORAL_CAPSULE | Freq: Two times a day (BID) | ORAL | 0 refills | Status: DC

## 2017-07-04 NOTE — Progress Notes (Signed)
Thank you for the details you included in the comment boxes. Those details are very helpful in determining the best course of treatment for you and help [place] to provide the best care. Etodolac is a little better than the naproxen you've taken before. Also, baclofen works as well or better than flexeril typically but does not have the loopy/cholinergic problems flexeril has. I sent you a LOW dose steroid dose pack if you want to use it but it's optional if you aren't feeling some improvement within 24h.   We are sorry that you are not feeling well.  Here is how we plan to help!  Based on what you have shared with me it looks like you mostly have acute back pain.  Acute back pain is defined as musculoskeletal pain that can resolve in 1-3 weeks with conservative treatment.  I have prescribed Etodolac 300 mg twice a day non-steroid anti-inflammatory (NSAID) as well as Baclofen 10 mg every eight hours as needed which is a muscle relaxer  Some patients experience stomach irritation or in increased heartburn with anti-inflammatory drugs.  Please keep in mind that muscle relaxer's can cause fatigue and should not be taken while at work or driving.  Back pain is very common.  The pain often gets better over time.  The cause of back pain is usually not dangerous.  Most people can learn to manage their back pain on their own.  Home Care  Stay active.  Start with short walks on flat ground if you can.  Try to walk farther each day.  Do not sit, drive or stand in one place for more than 30 minutes.  Do not stay in bed.  Do not avoid exercise or work.  Activity can help your back heal faster.  Be careful when you bend or lift an object.  Bend at your knees, keep the object close to you, and do not twist.  Sleep on a firm mattress.  Lie on your side, and bend your knees.  If you lie on your back, put a pillow under your knees.  Only take medicines as told by your doctor.  Put ice on the injured area.  Put  ice in a plastic bag  Place a towel between your skin and the bag  Leave the ice on for 15-20 minutes, 3-4 times a day for the first 2-3 days. 210 After that, you can switch between ice and heat packs.  Ask your doctor about back exercises or massage.  Avoid feeling anxious or stressed.  Find good ways to deal with stress, such as exercise.  Get Help Right Way If:  Your pain does not go away with rest or medicine.  Your pain does not go away in 1 week.  You have new problems.  You do not feel well.  The pain spreads into your legs.  You cannot control when you poop (bowel movement) or pee (urinate)  You feel sick to your stomach (nauseous) or throw up (vomit)  You have belly (abdominal) pain.  You feel like you may pass out (faint).  If you develop a fever.  Make Sure you:  Understand these instructions.  Will watch your condition  Will get help right away if you are not doing well or get worse.  Your e-visit answers were reviewed by a board certified advanced clinical practitioner to complete your personal care plan.  Depending on the condition, your plan could have included both over the counter or prescription medications.  If there is a problem please reply  once you have received a response from your provider.  Your safety is important to [place].  If you have drug allergies check your prescription carefully.    You can use MyChart to ask questions about today's visit, request a non-urgent call back, or ask for a work or school excuse for 24 hours related to this e-Visit. If it has been greater than 24 hours you will need to follow up with your provider, or enter a new e-Visit to address those concerns.  You will get an e-mail in the next two days asking about your experience.  I hope that your e-visit has been valuable and will speed your recovery. Thank you for using e-visits.

## 2017-07-22 MED FILL — VERAPAMIL 120 MG TABLET: 120 | 30 days supply | Qty: 30 | Fill #5

## 2017-07-24 DIAGNOSIS — M9902 Segmental and somatic dysfunction of thoracic region: Secondary | ICD-10-CM | POA: Diagnosis not present

## 2017-07-24 DIAGNOSIS — M5032 Other cervical disc degeneration, mid-cervical region, unspecified level: Secondary | ICD-10-CM | POA: Diagnosis not present

## 2017-07-24 DIAGNOSIS — M5137 Other intervertebral disc degeneration, lumbosacral region: Secondary | ICD-10-CM | POA: Diagnosis not present

## 2017-07-24 DIAGNOSIS — M9901 Segmental and somatic dysfunction of cervical region: Secondary | ICD-10-CM | POA: Diagnosis not present

## 2017-07-24 DIAGNOSIS — M791 Myalgia, unspecified site: Secondary | ICD-10-CM | POA: Diagnosis not present

## 2017-07-24 DIAGNOSIS — M531 Cervicobrachial syndrome: Secondary | ICD-10-CM | POA: Diagnosis not present

## 2017-07-24 DIAGNOSIS — M9903 Segmental and somatic dysfunction of lumbar region: Secondary | ICD-10-CM | POA: Diagnosis not present

## 2017-07-24 DIAGNOSIS — M9904 Segmental and somatic dysfunction of sacral region: Secondary | ICD-10-CM | POA: Diagnosis not present

## 2017-07-29 DIAGNOSIS — M5137 Other intervertebral disc degeneration, lumbosacral region: Secondary | ICD-10-CM | POA: Diagnosis not present

## 2017-07-29 DIAGNOSIS — M531 Cervicobrachial syndrome: Secondary | ICD-10-CM | POA: Diagnosis not present

## 2017-07-29 DIAGNOSIS — M5032 Other cervical disc degeneration, mid-cervical region, unspecified level: Secondary | ICD-10-CM | POA: Diagnosis not present

## 2017-07-29 DIAGNOSIS — M9902 Segmental and somatic dysfunction of thoracic region: Secondary | ICD-10-CM | POA: Diagnosis not present

## 2017-07-29 DIAGNOSIS — M9904 Segmental and somatic dysfunction of sacral region: Secondary | ICD-10-CM | POA: Diagnosis not present

## 2017-07-29 DIAGNOSIS — M791 Myalgia, unspecified site: Secondary | ICD-10-CM | POA: Diagnosis not present

## 2017-07-29 DIAGNOSIS — M9903 Segmental and somatic dysfunction of lumbar region: Secondary | ICD-10-CM | POA: Diagnosis not present

## 2017-07-29 DIAGNOSIS — M9901 Segmental and somatic dysfunction of cervical region: Secondary | ICD-10-CM | POA: Diagnosis not present

## 2017-07-31 DIAGNOSIS — M5032 Other cervical disc degeneration, mid-cervical region, unspecified level: Secondary | ICD-10-CM | POA: Diagnosis not present

## 2017-07-31 DIAGNOSIS — M531 Cervicobrachial syndrome: Secondary | ICD-10-CM | POA: Diagnosis not present

## 2017-07-31 DIAGNOSIS — M5137 Other intervertebral disc degeneration, lumbosacral region: Secondary | ICD-10-CM | POA: Diagnosis not present

## 2017-07-31 DIAGNOSIS — M9904 Segmental and somatic dysfunction of sacral region: Secondary | ICD-10-CM | POA: Diagnosis not present

## 2017-07-31 DIAGNOSIS — M9902 Segmental and somatic dysfunction of thoracic region: Secondary | ICD-10-CM | POA: Diagnosis not present

## 2017-07-31 DIAGNOSIS — M9901 Segmental and somatic dysfunction of cervical region: Secondary | ICD-10-CM | POA: Diagnosis not present

## 2017-07-31 DIAGNOSIS — M9903 Segmental and somatic dysfunction of lumbar region: Secondary | ICD-10-CM | POA: Diagnosis not present

## 2017-07-31 DIAGNOSIS — M791 Myalgia, unspecified site: Secondary | ICD-10-CM | POA: Diagnosis not present

## 2017-08-07 DIAGNOSIS — M9904 Segmental and somatic dysfunction of sacral region: Secondary | ICD-10-CM | POA: Diagnosis not present

## 2017-08-07 DIAGNOSIS — M5032 Other cervical disc degeneration, mid-cervical region, unspecified level: Secondary | ICD-10-CM | POA: Diagnosis not present

## 2017-08-07 DIAGNOSIS — M9902 Segmental and somatic dysfunction of thoracic region: Secondary | ICD-10-CM | POA: Diagnosis not present

## 2017-08-07 DIAGNOSIS — M791 Myalgia, unspecified site: Secondary | ICD-10-CM | POA: Diagnosis not present

## 2017-08-07 DIAGNOSIS — M9901 Segmental and somatic dysfunction of cervical region: Secondary | ICD-10-CM | POA: Diagnosis not present

## 2017-08-07 DIAGNOSIS — M5137 Other intervertebral disc degeneration, lumbosacral region: Secondary | ICD-10-CM | POA: Diagnosis not present

## 2017-08-07 DIAGNOSIS — M531 Cervicobrachial syndrome: Secondary | ICD-10-CM | POA: Diagnosis not present

## 2017-08-07 DIAGNOSIS — M9903 Segmental and somatic dysfunction of lumbar region: Secondary | ICD-10-CM | POA: Diagnosis not present

## 2017-08-26 MED FILL — VERAPAMIL 120 MG TABLET: 120 | 30 days supply | Qty: 30 | Fill #0

## 2017-09-17 MED FILL — FLUoxetine HCL 20 MG CAPS: 20 | 30 days supply | Qty: 60 | Fill #4

## 2017-09-24 MED FILL — VERAPAMIL 120 MG TABLET: 120 | 30 days supply | Qty: 30 | Fill #1

## 2017-10-27 DIAGNOSIS — G4733 Obstructive sleep apnea (adult) (pediatric): Secondary | ICD-10-CM | POA: Diagnosis not present

## 2017-10-27 DIAGNOSIS — Z6841 Body Mass Index (BMI) 40.0 and over, adult: Secondary | ICD-10-CM | POA: Diagnosis not present

## 2017-10-27 DIAGNOSIS — Z125 Encounter for screening for malignant neoplasm of prostate: Secondary | ICD-10-CM | POA: Diagnosis not present

## 2017-10-27 DIAGNOSIS — Z9989 Dependence on other enabling machines and devices: Secondary | ICD-10-CM | POA: Diagnosis not present

## 2017-10-27 DIAGNOSIS — Z13220 Encounter for screening for lipoid disorders: Secondary | ICD-10-CM | POA: Diagnosis not present

## 2017-10-27 DIAGNOSIS — I10 Essential (primary) hypertension: Secondary | ICD-10-CM | POA: Diagnosis not present

## 2017-10-27 DIAGNOSIS — R7302 Impaired glucose tolerance (oral): Secondary | ICD-10-CM | POA: Diagnosis not present

## 2017-10-27 DIAGNOSIS — Z0000 Encounter for general adult medical examination without abnormal findings: Secondary | ICD-10-CM | POA: Diagnosis not present

## 2017-10-27 DIAGNOSIS — M1710 Unilateral primary osteoarthritis, unspecified knee: Secondary | ICD-10-CM | POA: Diagnosis not present

## 2017-10-28 MED FILL — VERAPAMIL 120 MG TABLET: 120 | 30 days supply | Qty: 30 | Fill #2

## 2017-10-28 MED FILL — METFORMIN HCL ER 500 MG TAB: 500 | 30 days supply | Qty: 60 | Fill #0

## 2017-10-29 HISTORY — PX: COLONOSCOPY: SHX174

## 2017-11-11 DIAGNOSIS — K621 Rectal polyp: Secondary | ICD-10-CM | POA: Diagnosis not present

## 2017-11-11 DIAGNOSIS — Z1211 Encounter for screening for malignant neoplasm of colon: Secondary | ICD-10-CM | POA: Diagnosis not present

## 2017-11-11 DIAGNOSIS — D120 Benign neoplasm of cecum: Secondary | ICD-10-CM | POA: Diagnosis not present

## 2017-11-27 MED FILL — FLUoxetine HCL 20 MG CAPS: 20 | 30 days supply | Qty: 60 | Fill #5

## 2017-11-27 MED FILL — METFORMIN HCL ER 500 MG TAB: 500 | 30 days supply | Qty: 60 | Fill #1

## 2017-11-27 MED FILL — VERAPAMIL 120 MG TABLET: 120 | 30 days supply | Qty: 30 | Fill #3

## 2017-12-12 DIAGNOSIS — S39012A Strain of muscle, fascia and tendon of lower back, initial encounter: Secondary | ICD-10-CM | POA: Diagnosis not present

## 2017-12-12 DIAGNOSIS — I10 Essential (primary) hypertension: Secondary | ICD-10-CM | POA: Insufficient documentation

## 2017-12-12 DIAGNOSIS — G4733 Obstructive sleep apnea (adult) (pediatric): Secondary | ICD-10-CM | POA: Diagnosis not present

## 2017-12-12 DIAGNOSIS — Z9989 Dependence on other enabling machines and devices: Secondary | ICD-10-CM | POA: Diagnosis not present

## 2017-12-12 DIAGNOSIS — R7302 Impaired glucose tolerance (oral): Secondary | ICD-10-CM | POA: Diagnosis not present

## 2017-12-12 DIAGNOSIS — E663 Overweight: Secondary | ICD-10-CM | POA: Diagnosis not present

## 2017-12-29 MED FILL — METFORMIN HCL ER 500 MG TAB: 500 | 30 days supply | Qty: 60 | Fill #2

## 2017-12-29 MED FILL — VERAPAMIL 120 MG TABLET: 120 | 30 days supply | Qty: 30 | Fill #4

## 2018-01-27 MED FILL — METFORMIN HCL ER 500 MG TAB: 500 | 30 days supply | Qty: 60 | Fill #3

## 2018-01-27 MED FILL — VERAPAMIL 120 MG TABLET: 120 | 30 days supply | Qty: 30 | Fill #5

## 2018-01-27 MED FILL — FLUoxetine HCL 20 MG CAPS: 20 | 30 days supply | Qty: 60 | Fill #0

## 2018-02-25 MED FILL — METFORMIN HCL ER 500 MG TAB: 500 | 30 days supply | Qty: 60 | Fill #4

## 2018-02-25 MED FILL — VERAPAMIL 120 MG TABLET: 120 | 30 days supply | Qty: 30 | Fill #0

## 2018-02-25 MED FILL — FLUoxetine HCL 20 MG CAPS: 20 | 30 days supply | Qty: 60 | Fill #1

## 2018-03-13 DIAGNOSIS — H40013 Open angle with borderline findings, low risk, bilateral: Secondary | ICD-10-CM | POA: Diagnosis not present

## 2018-03-25 MED FILL — METFORMIN HCL ER 500 MG TAB: 500 | 30 days supply | Qty: 60 | Fill #5

## 2018-03-25 MED FILL — FLUoxetine HCL 20 MG CAPS: 20 | 30 days supply | Qty: 60 | Fill #2

## 2018-03-25 MED FILL — VERAPAMIL 120 MG TABLET: 120 | 30 days supply | Qty: 30 | Fill #1

## 2018-04-03 DIAGNOSIS — H40013 Open angle with borderline findings, low risk, bilateral: Secondary | ICD-10-CM | POA: Diagnosis not present

## 2018-04-30 MED FILL — VERAPAMIL HCL 120 MG TABS: 120 | 30 days supply | Qty: 30 | Fill #2

## 2018-04-30 MED FILL — metFORMIN HCL ER 500 MG TB2: 500 | 30 days supply | Qty: 60 | Fill #6

## 2018-05-26 MED FILL — metFORMIN HCL ER 500 MG TB2: 500 | 30 days supply | Qty: 60 | Fill #7

## 2018-05-26 MED FILL — VERAPAMIL HCL 120 MG TABS: 120 | 30 days supply | Qty: 30 | Fill #3

## 2018-06-25 MED FILL — VERAPAMIL HCL 120 MG TABS: 120 | 30 days supply | Qty: 30 | Fill #4

## 2018-06-25 MED FILL — metFORMIN HCL ER 500 MG TB2: 500 | 30 days supply | Qty: 60 | Fill #8

## 2018-07-29 MED FILL — VERAPAMIL HCL 120 MG TABS: 120 | 30 days supply | Qty: 30 | Fill #5

## 2018-07-29 MED FILL — metFORMIN HCL ER 500 MG TB2: 500 | 30 days supply | Qty: 60 | Fill #9

## 2018-08-10 DIAGNOSIS — M25531 Pain in right wrist: Secondary | ICD-10-CM | POA: Diagnosis not present

## 2018-08-14 ENCOUNTER — Ambulatory Visit (INDEPENDENT_AMBULATORY_CARE_PROVIDER_SITE_OTHER): Payer: 59 | Admitting: Family Medicine

## 2018-08-14 ENCOUNTER — Encounter: Payer: Self-pay | Admitting: Family Medicine

## 2018-08-14 VITALS — BP 120/80 | HR 92 | Ht 73.0 in | Wt 335.0 lb

## 2018-08-14 DIAGNOSIS — M25531 Pain in right wrist: Secondary | ICD-10-CM

## 2018-08-14 NOTE — Patient Instructions (Signed)
You have a strain and tendinitis of your flexor carpi ulnaris tendon. There are chronic changes at your TFCC too but this isn't causing your current symptoms. Wear wrist brace as often as possible until this resolves. Icing 15 minutes at a time 3-4 times a day. Ibuprofen 800mg  three times a day OR aleve 2 tabs twice a day with food for pain and inflammation - take regularly for 7 days then as needed. Consider occupational therapy if you're struggling. Follow up with me in 5 weeks if not improved but you can call me sooner with any questions or concerns.

## 2018-08-15 ENCOUNTER — Encounter: Payer: Self-pay | Admitting: Family Medicine

## 2018-08-15 NOTE — Progress Notes (Signed)
PCP: Franki Monte, MD  Subjective:   HPI: Patient is a 53 y.o. male here for right wrist pain.  Patient reports about 20 years ago he sustained injury to right wrist and doesn't feel like this ever completely healed. Pain would resolve for periods but whenever he tries to bowl he gets pain similar to current pain ulnar side of wrist. Current pain present for a week though when usually it resolves after 1-2 days. Pain 2/10 but up to 8/10 and sharp. Taking ibuprofen 800 bid and using wrist brace. Iced first few days. Had swelling initially. Pain with wrist motions. Right handed. No skin changes, numbness.  Past Medical History:  Diagnosis Date  . Esophageal spasm    "RX controlled" (10/20/2014)  . Kidney stones 05/2014   "passed it"  . OSA on CPAP     Current Outpatient Medications on File Prior to Visit  Medication Sig Dispense Refill  . cholecalciferol (VITAMIN D) 1000 UNITS tablet Take 1,000 Units by mouth daily.    Marland Kitchen etodolac (LODINE) 300 MG capsule Take 1 capsule (300 mg total) by mouth 2 (two) times daily. 20 capsule 0  . fluocinonide ointment (LIDEX) 0.05 % APPLY TO AFFECTED AREA EVERY DAY  0  . FLUoxetine (PROZAC) 20 MG capsule   11  . metFORMIN (GLUCOPHAGE-XR) 500 MG 24 hr tablet     . Multiple Vitamins-Minerals (MULTIVITAMIN WITH MINERALS) tablet Take 1 tablet by mouth daily.    . verapamil (CALAN) 120 MG tablet Take 120 mg by mouth daily.     Marland Kitchen zolpidem (AMBIEN) 10 MG tablet Take 10 mg by mouth at bedtime as needed for sleep.     No current facility-administered medications on file prior to visit.     Past Surgical History:  Procedure Laterality Date  . CARDIAC CATHETERIZATION  2011   "completely clean"    No Known Allergies  Social History   Socioeconomic History  . Marital status: Married    Spouse name: Not on file  . Number of children: Not on file  . Years of education: Not on file  . Highest education level: Not on file  Occupational History   . Not on file  Social Needs  . Financial resource strain: Not on file  . Food insecurity:    Worry: Not on file    Inability: Not on file  . Transportation needs:    Medical: Not on file    Non-medical: Not on file  Tobacco Use  . Smoking status: Never Smoker  . Smokeless tobacco: Former Systems developer  . Tobacco comment: "quit chewing in the 1990's"  Substance and Sexual Activity  . Alcohol use: Yes    Alcohol/week: 0.0 standard drinks    Comment: 10/20/2014 "might have a few drinks a few times/yr"  . Drug use: No  . Sexual activity: Yes  Lifestyle  . Physical activity:    Days per week: Not on file    Minutes per session: Not on file  . Stress: Not on file  Relationships  . Social connections:    Talks on phone: Not on file    Gets together: Not on file    Attends religious service: Not on file    Active member of club or organization: Not on file    Attends meetings of clubs or organizations: Not on file    Relationship status: Not on file  . Intimate partner violence:    Fear of current or ex partner: Not on file  Emotionally abused: Not on file    Physically abused: Not on file    Forced sexual activity: Not on file  Other Topics Concern  . Not on file  Social History Narrative  . Not on file    Family History  Problem Relation Age of Onset  . Diabetes Mellitus II Unknown        Grandmother, mother side  . Liver cancer Unknown        Grandmother, father side    BP 120/80   Pulse 92   Ht 6\' 1"  (1.854 m)   Wt (!) 335 lb (152 kg)   BMI 44.20 kg/m   Review of Systems: See HPI above.     Objective:  Physical Exam:  Gen: NAD, comfortable in exam room  Right wrist: No deformity, swelling, bruising. FROM with 5/5 strength but pain on ulnar deviation and flexion - pain on extension but felt on volar side.  FROM digits without pain, 5/5 strength. TTP over flexor carpi ulnaris tendon distally to insertion.  No TFCC, bony or other tenderness. NVI  distally. Negative tinels.  Left wrist: No deformity. FROM with 5/5 strength. No tenderness to palpation. NVI distally.   MSK u/s:  Chronic changes right TFCC with small calcifications but no acute tear.  Thickening of flexor carpi ulnaris tendon with focal tenosynovitis at insertion.  No bony abnormalities.  Extensor carpi ulnaris tendon without abnormalities.  Assessment & Plan:  1. Right wrist pain - 2/2 strain and tendinitis of flexor carpi ulnaris tendon.  Wrist brace, icing, ibuprofen.  Consider occupational therapy if struggling.  F/u in 5 weeks.

## 2018-08-19 ENCOUNTER — Emergency Department (INDEPENDENT_AMBULATORY_CARE_PROVIDER_SITE_OTHER)
Admission: EM | Admit: 2018-08-19 | Discharge: 2018-08-19 | Disposition: A | Discharge status: Home / Self Care | Payer: 59 | Source: Home / Self Care | Attending: Emergency Medicine | Admitting: Emergency Medicine

## 2018-08-19 ENCOUNTER — Other Ambulatory Visit: Payer: Self-pay

## 2018-08-19 ENCOUNTER — Encounter: Payer: Self-pay | Admitting: *Deleted

## 2018-08-19 DIAGNOSIS — H01001 Unspecified blepharitis right upper eyelid: Secondary | ICD-10-CM

## 2018-08-19 DIAGNOSIS — H1031 Unspecified acute conjunctivitis, right eye: Secondary | ICD-10-CM

## 2018-08-19 MED ORDER — DOXYCYCLINE HYCLATE 100 MG PO CAPS: 100.0000 mg | ORAL_CAPSULE | Freq: Two times a day (BID) | ORAL | 0 refills | Status: DC

## 2018-08-19 MED ORDER — BACITRACIN-POLYMYXIN B 500-10000 UNIT/GM OP OINT: 1.0000 "application " | TOPICAL_OINTMENT | Freq: Two times a day (BID) | OPHTHALMIC | 0 refills | Status: DC

## 2018-08-19 MED FILL — BACITRACIN-POLYMYXIN EYE OI: 500-10000 | 15 days supply | Qty: 4 | Fill #0

## 2018-08-19 MED FILL — DOXYCYCLINE HYCLATE 100 MG: 100 | 10 days supply | Qty: 20 | Fill #0

## 2018-08-19 NOTE — ED Provider Notes (Signed)
[name]    CSN: [id number] Arrival date & time: 08/19/18  0804     History   Chief Complaint Chief Complaint  Patient presents with  . Eye Problem    HPI Benjamin Lee is a 53 y.o. male.   The history is provided by the patient.  Eye Problem  Location:  Right eye Quality: Right upper eyelid swelling and redness and drainage, matted discharge right eye.  Onset yesterday. Severity:  Moderate Progression:  Worsening Chronicity:  New Context: not burn, not chemical exposure, not direct trauma, not foreign body and not using machinery   Context comment:  Recalls no injury.  Had blepharitis in the distant past, treated effectively with oral antibiotic and ophthalmologic ointment, and that worked well Worsened by:  Contact Ineffective treatments:  None tried Associated symptoms: crusting, discharge, redness and swelling   Associated symptoms: no blurred vision, no decreased vision, no double vision, no facial rash, no headaches, no itching, no nausea, no numbness, no scotomas, no tingling, no vomiting and no weakness   Risk factors: no recent herpes zoster and no recent URI     Past Medical History:  Diagnosis Date  . Esophageal spasm    "RX controlled" (10/20/2014)  . Kidney stones 05/2014   "passed it"  . OSA on CPAP    He states A1c 6.7 about 6 months ago and PCP placed him on metformin, and he will be following up soon with PCP. Denies hypoglycemia symptoms currently.  No polyuria or polydipsia. Patient Active Problem List   Diagnosis Date Noted  . Hand injury, right, initial encounter 03/25/2017  . Morbid (severe) obesity due to excess calories ([place]) 04/11/2015  . Right hip pain 04/11/2015  . Pericardial effusion   . Dyspnea on exertion 10/20/2014  . Streptococcal sore throat 10/20/2014  . SOB (shortness of breath) 10/20/2014  . Pericarditis 10/20/2014  . Sleep apnea   . Esophageal spasm     Past Surgical History:  Procedure Laterality Date    . CARDIAC CATHETERIZATION  2011   "completely clean"       Home Medications    Prior to Admission medications   Medication Sig Start Date End Date Taking? Authorizing Provider  bacitracin-polymyxin b (POLYSPORIN) ophthalmic ointment Place 1 application into the right eye 2 (two) times daily. apply to eye every 12 hours while awake 08/19/18   Jacqulyn Cane, MD  doxycycline (VIBRAMYCIN) 100 MG capsule Take 1 capsule (100 mg total) by mouth 2 (two) times daily. 08/19/18   Jacqulyn Cane, MD  metFORMIN (GLUCOPHAGE-XR) 500 MG 24 hr tablet  07/29/18   [provider]  Multiple Vitamins-Minerals (MULTIVITAMIN WITH MINERALS) tablet Take 1 tablet by mouth daily.    [provider]  verapamil (CALAN) 120 MG tablet Take 120 mg by mouth daily.     [provider]  zolpidem (AMBIEN) 10 MG tablet Take 10 mg by mouth at bedtime as needed for sleep.    [provider]    Family History Family History  Problem Relation Age of Onset  . Diabetes Mellitus II Other        Grandmother, mother side  . Liver cancer Other        Grandmother, father side    Social History Social History   Tobacco Use  . Smoking status: Never Smoker  . Smokeless tobacco: Former Systems developer  . Tobacco comment: "quit chewing in the 1990's"  Substance Use Topics  . Alcohol use: Yes  Alcohol/week: 0.0 standard drinks    Comment: 10/20/2014 "might have a few drinks a few times/yr"  . Drug use: No   Reviewed above past medical history, family history and social history.  Allergies   Patient has no known allergies.   Review of Systems Review of Systems  Eyes: Positive for discharge and redness. Negative for blurred vision, double vision and itching.  Gastrointestinal: Negative for nausea and vomiting.  Neurological: Negative for tingling, weakness, numbness and headaches.  All other systems reviewed and are negative.    Physical Exam Triage Vital Signs ED Triage Vitals [08/19/18  0824]  Enc Vitals Group     BP (!) 144/89     Pulse Rate 90     Resp 14     Temp 98.1 F (36.7 C)     Temp Source Oral     SpO2 95 %     Weight (!) 345 lb (156.5 kg)     Height      Head Circumference      Peak Flow      Pain Score 3     Pain Loc      Pain Edu?      Excl. in [place]?    No data found.  Updated Vital Signs BP (!) 144/89 (BP Location: Left Arm)   Pulse 90   Temp 98.1 F (36.7 C) (Oral)   Resp 14   Wt (!) 156.5 kg   SpO2 95%   BMI 45.52 kg/m   Visual Acuity Right Eye Distance: 20/25 Left Eye Distance: 20/25 Bilateral Distance: 20/20(Glasses)  Right Eye Near:   Left Eye Near:    Bilateral Near:     Physical Exam Vitals signs reviewed.  Constitutional:      General: He is not in acute distress.    Appearance: He is well-developed.  HENT:     Head: Normocephalic and atraumatic.  Eyes:     General: No scleral icterus.       Right eye: No foreign body, discharge or hordeolum.        Left eye: No foreign body, discharge or hordeolum.     Extraocular Movements: Extraocular movements intact.     Conjunctiva/sclera:     Right eye: Right conjunctiva is injected. No exudate or hemorrhage.    Left eye: Left conjunctiva is not injected. No exudate or hemorrhage.    Pupils: Pupils are equal, round, and reactive to light.      Comments: I  examined under right upper and lower eyelid, no foreign body seen. Right upper eyelid swollen and red, no mass or fluctuance or drainage.  Minimal warmth.  No specific induration. Right conjunctiva injected without other abnormalities or foreign body.  No drainage or discharge seen.   Neck:     Musculoskeletal: Normal range of motion and neck supple.  Cardiovascular:     Rate and Rhythm: Normal rate and regular rhythm.  Pulmonary:     Effort: Pulmonary effort is normal.  Abdominal:     General: There is no distension.  Skin:    General: Skin is warm and dry.     Comments: No facial rash  Neurological:     Mental  Status: He is alert and oriented to person, place, and time.     Cranial Nerves: No cranial nerve deficit.  Psychiatric:        Behavior: Behavior normal.      UC Treatments / Results  Labs (all labs ordered are listed, but  only abnormal results are displayed) Labs Reviewed - No data to display  EKG None  Radiology No results found.  Procedures Procedures (including critical care time)  Medications Ordered in UC Medications - No data to display  Initial Impression / Assessment and Plan / UC Course  I have reviewed the triage vital signs and the nursing notes.  Pertinent labs & imaging results that were available during my care of the patient were reviewed by me and considered in my medical decision making (see chart for details).      Final Clinical Impressions(s) / UC Diagnoses   Final diagnoses:  Blepharitis of right upper eyelid, unspecified type  Acute bacterial conjunctivitis of right eye   Treatment options researched on up-to-date software.  Discussed, as well as risks, benefits, alternatives. Patient voiced understanding and agreement with the following plans:    Discharge Instructions     Doxycycline twice a day for 7 to 10 days orally. Antibiotic eye ointment, applied twice a day to right eye. Follow-up with your eye doctor if not improving 3 to 4 days, or sooner if worse or new symptoms. Please read attached instruction sheets.    ED Prescriptions    Medication Sig Dispense Auth. Provider   doxycycline (VIBRAMYCIN) 100 MG capsule Take 1 capsule (100 mg total) by mouth 2 (two) times daily. 20 capsule Jacqulyn Cane, MD   bacitracin-polymyxin b (POLYSPORIN) ophthalmic ointment Place 1 application into the right eye 2 (two) times daily. apply to eye every 12 hours while awake 3.5 g Jacqulyn Cane, MD     An After Visit Summary was printed and given to the patient. Precautions discussed. Red flags discussed. Questions invited and answered. Patient  voiced understanding and agreement.    Jacqulyn Cane, MD 08/19/18 [phone]

## 2018-08-19 NOTE — Discharge Instructions (Addendum)
Doxycycline twice a day for 7 to 10 days orally. Antibiotic eye ointment, applied twice a day to right eye. Follow-up with your eye doctor if not improving 3 to 4 days, or sooner if worse or new symptoms. Please read attached instruction sheets.

## 2018-08-19 NOTE — ED Triage Notes (Signed)
Patient c/o right eye swelling and drainage x yesterday. No other symptoms.

## 2018-08-27 MED FILL — VERAPAMIL HCL 120 MG TABS: 120 | 30 days supply | Qty: 30 | Fill #0

## 2018-08-27 MED FILL — metFORMIN HCL ER 500 MG TB2: 500 | 30 days supply | Qty: 60 | Fill #10

## 2018-09-07 DIAGNOSIS — E1165 Type 2 diabetes mellitus with hyperglycemia: Secondary | ICD-10-CM | POA: Insufficient documentation

## 2018-09-07 DIAGNOSIS — G4733 Obstructive sleep apnea (adult) (pediatric): Secondary | ICD-10-CM | POA: Diagnosis not present

## 2018-09-07 DIAGNOSIS — E119 Type 2 diabetes mellitus without complications: Secondary | ICD-10-CM | POA: Insufficient documentation

## 2018-09-07 DIAGNOSIS — Z6841 Body Mass Index (BMI) 40.0 and over, adult: Secondary | ICD-10-CM | POA: Diagnosis not present

## 2018-09-07 DIAGNOSIS — M7130 Other bursal cyst, unspecified site: Secondary | ICD-10-CM | POA: Diagnosis not present

## 2018-09-07 DIAGNOSIS — E1169 Type 2 diabetes mellitus with other specified complication: Secondary | ICD-10-CM | POA: Diagnosis not present

## 2018-09-07 DIAGNOSIS — N400 Enlarged prostate without lower urinary tract symptoms: Secondary | ICD-10-CM | POA: Diagnosis not present

## 2018-09-11 MED FILL — metFORMIN HCL ER 500 MG TB2: 500 | 30 days supply | Qty: 120 | Fill #0 | Status: TO

## 2018-09-18 MED FILL — VERAPAMIL HCL 120 MG TABS: 120 | 30 days supply | Qty: 30 | Fill #1 | Status: TO

## 2018-10-23 MED FILL — VERAPAMIL 120 MG TABLET: 120 | 30 days supply | Qty: 30 | Fill #0 | Status: TO

## 2018-10-23 MED FILL — metFORMIN HCL ER 500 MG TB2: 500 | 30 days supply | Qty: 120 | Fill #0

## 2018-11-16 MED FILL — metFORMIN HCL ER 500 MG TB2: 500 | 30 days supply | Qty: 120 | Fill #1 | Status: TO

## 2018-11-24 MED FILL — VERAPAMIL 120 MG TABLET: 120 | 90 days supply | Qty: 90 | Fill #0

## 2018-12-23 MED FILL — metFORMIN HCL ER 500 MG TB2: 500 | 30 days supply | Qty: 120 | Fill #0

## 2019-01-19 MED FILL — METFORMIN HCL ER 500 MG TB2: 500 | 30 days supply | Qty: 120 | Fill #1

## 2019-02-05 ENCOUNTER — Ambulatory Visit (INDEPENDENT_AMBULATORY_CARE_PROVIDER_SITE_OTHER): Payer: 59

## 2019-02-05 ENCOUNTER — Encounter: Payer: Self-pay | Admitting: Podiatry

## 2019-02-05 ENCOUNTER — Ambulatory Visit (INDEPENDENT_AMBULATORY_CARE_PROVIDER_SITE_OTHER): Payer: 59 | Admitting: Podiatry

## 2019-02-05 ENCOUNTER — Other Ambulatory Visit: Payer: Self-pay

## 2019-02-05 VITALS — BP 149/85 | HR 93 | Temp 98.0°F | Resp 16

## 2019-02-05 DIAGNOSIS — M6740 Ganglion, unspecified site: Secondary | ICD-10-CM | POA: Diagnosis not present

## 2019-02-05 DIAGNOSIS — M722 Plantar fascial fibromatosis: Secondary | ICD-10-CM

## 2019-02-05 DIAGNOSIS — Z86010 Personal history of colonic polyps: Secondary | ICD-10-CM | POA: Insufficient documentation

## 2019-02-05 DIAGNOSIS — Z860101 Personal history of adenomatous and serrated colon polyps: Secondary | ICD-10-CM | POA: Insufficient documentation

## 2019-02-05 NOTE — Patient Instructions (Signed)

## 2019-02-08 ENCOUNTER — Other Ambulatory Visit: Payer: Self-pay | Admitting: Podiatry

## 2019-02-08 DIAGNOSIS — M722 Plantar fascial fibromatosis: Secondary | ICD-10-CM

## 2019-02-10 NOTE — Progress Notes (Signed)
Subjective:   Patient ID: Benjamin Lee, male   DOB: 53 y.o.   MRN: [id number]   HPI 53 year old male presents the office for concerns of right heel pain as well as a cyst on the left second toe.  He states the same deformity on the right in the morning of his feet all day.  Denies any recent injury or trauma.  He had a cyst on the left second toe for quite some time and then watch it but has been getting larger, irritation.  No recent injury or trauma no other concerns today.   Review of Systems  All other systems reviewed and are negative.  Past Medical History:  Diagnosis Date  . Esophageal spasm    "RX controlled" (10/20/2014)  . Kidney stones 05/2014   "passed it"  . OSA on CPAP     Past Surgical History:  Procedure Laterality Date  . CARDIAC CATHETERIZATION  2011   "completely clean"     Current Outpatient Medications:  .  metFORMIN (GLUCOPHAGE-XR) 500 MG 24 hr tablet, , Disp: , Rfl:  .  Multiple Vitamins-Minerals (MULTIVITAMIN WITH MINERALS) tablet, Take 1 tablet by mouth daily., Disp: , Rfl:  .  verapamil (CALAN) 120 MG tablet, Take 120 mg by mouth daily. , Disp: , Rfl:  .  zolpidem (AMBIEN) 10 MG tablet, Take 10 mg by mouth at bedtime as needed for sleep., Disp: , Rfl:   No Known Allergies      Objective:  Physical Exam  General: AAO x3, NAD  Dermatological: There is a fluid-filled mobile soft tissue mass to the dorsal aspect left second toe on the DIPJ which appears to be a ganglion cyst.  No open lesions.  Vascular: Dorsalis Pedis artery and Posterior Tibial artery pedal pulses are 2/4 bilateral with immedate capillary fill time. There is no pain with calf compression, swelling, warmth, erythema.   Neruologic: Grossly intact via light touch bilateral. Vibratory intact via tuning fork bilateral. Protective threshold with Semmes Wienstein monofilament intact to all pedal sites bilateral.  Negative Tinel sign.  Musculoskeletal: Tenderness to palpation along the  plantar medial tubercle of the calcaneus at the insertion of plantar fascia on the right foot. There is no pain along the course of the plantar fascia within the arch of the foot. Plantar fascia appears to be intact. There is no pain with lateral compression of the calcaneus or pain with vibratory sensation. There is no pain along the course or insertion of the achilles tendon. No other areas of tenderness to bilateral lower extremities.  Gait: Unassisted, Nonantalgic.       Assessment:   Right foot plan fasciitis, left foot soft tissue mass likely ganglion cyst     Plan:  -Treatment options discussed including all alternatives, risks, and complications -Etiology of symptoms were discussed -X-rays were obtained and reviewed with the patient.  There is no evidence of acute fracture or stress fracture.  1.  Right heel pain, plantar fasciitis -Steroid injection upon today.  See procedure note below. -Discussed resting, icing daily. -Plantar fascial brace was dispensed. -Discussed shoe modifications and orthotics -Stretching exercises daily  Procedure: Injection Tendon/Ligament Discussed alternatives, risks, complications and verbal consent was obtained.  Location:Right plantar fascia at the glabrous junction; medial approach. Skin Prep: Alcohol Injectate: 0.5cc 0.5% marcaine plain, 0.5 cc 2% lidocaine plain and, 1 cc kenalog 10. Disposition: Patient tolerated procedure well. Injection site dressed with a band-aid.  Post-injection care was discussed and return precautions discussed.   2.  Left foot ganglion cyst, 2nd digit -Discussed with conservative as well as surgical options.  Recommend aspiration with him and wishes to proceed.  Mixture of 3 cc of 2% (0.5% Marcaine plain was infiltrated in digital block fashion. The toes prepped with Betadine.  An 18-gauge needle was utilized to aspirate clear, jellylike fluid from the dorsal aspect of the second digit.  Quarter cc dexamethasone and  quarter cc Marcaine plain was infiltrated lesion.  Compression was applied.  Postprocedure course was discussed.     Trula Slade DPM

## 2019-02-15 MED FILL — METFORMIN HCL ER 500 MG TB2: 500 | 30 days supply | Qty: 120 | Fill #2

## 2019-02-22 MED FILL — ZOLPIDEM TARTRATE 10 MG TAB: 10 | 90 days supply | Qty: 90 | Fill #0

## 2019-02-23 MED FILL — VERAPAMIL HCL 120 MG TABS: 120 | 90 days supply | Qty: 90 | Fill #0

## 2019-02-26 ENCOUNTER — Ambulatory Visit: Payer: 59 | Admitting: Podiatry

## 2019-02-28 ENCOUNTER — Emergency Department (INDEPENDENT_AMBULATORY_CARE_PROVIDER_SITE_OTHER)
Admission: EM | Admit: 2019-02-28 | Discharge: 2019-02-28 | Disposition: A | Discharge status: Home / Self Care | Payer: 59 | Source: Home / Self Care | Attending: Family Medicine | Admitting: Family Medicine

## 2019-02-28 ENCOUNTER — Other Ambulatory Visit: Payer: Self-pay

## 2019-02-28 DIAGNOSIS — L03316 Cellulitis of umbilicus: Secondary | ICD-10-CM

## 2019-02-28 LAB — POCT CBC W AUTO DIFF (K'VILLE URGENT CARE)

## 2019-02-28 MED ORDER — VALACYCLOVIR HCL 1 G PO TABS: ORAL_TABLET | ORAL | 0 refills | Status: DC

## 2019-02-28 MED ORDER — HYDROCODONE-ACETAMINOPHEN 5-325 MG PO TABS: 1.0000 | ORAL_TABLET | Freq: Four times a day (QID) | ORAL | 0 refills | Status: AC | PRN

## 2019-02-28 MED ORDER — CLINDAMYCIN HCL 300 MG PO CAPS: ORAL_CAPSULE | ORAL | 0 refills | Status: DC

## 2019-02-28 NOTE — ED Triage Notes (Signed)
Pt c/o rash on stomach that started Friday. Painful and hot to touch. Has been experiencing hot flashes. Tried tylenol prn.

## 2019-02-28 NOTE — Discharge Instructions (Addendum)
May apply warm compress 2 to 3 times daily.  If symptoms become significantly worse during the night or over the weekend, proceed to the local emergency room.

## 2019-02-28 NOTE — ED Provider Notes (Signed)
[facility]    CSN: [id number] Arrival date & time: 02/28/19  1209      History   Chief Complaint Chief Complaint  Patient presents with  . Rash    HPI BRENNDAN Lee is a 53 y.o. male.   Patient suddenly developed a painful erythematous rash above and to the left of his umbilicus 2 days ago.  The rash has become larger and more painful.  He has also experienced fatigue, mild myalgias, headache, and chills/sweats.  He now experiences pain with movement and deep inspiration/cough.  He denies cough or respiratory symptoms.    The history is provided by the patient.  Rash Location: abdomen. Quality: burning, dryness, itchiness, painful, redness and swelling   Quality: not blistering, not bruising, not draining, not peeling, not scaling and not weeping   Pain details:    Quality:  Aching, itching and burning   Severity:  Moderate   Onset quality:  Sudden   Duration:  2 days   Timing:  Constant   Progression:  Worsening Severity:  Moderate Onset quality:  Sudden Duration:  2 days Timing:  Constant Progression:  Worsening Chronicity:  New Context: not animal contact, not chemical exposure, not exposure to similar rash, not food, not hot tub use, not insect bite/sting, not medications, not new detergent/soap and not plant contact   Relieved by:  Nothing Ineffective treatments:  Antihistamines Associated symptoms: abdominal pain, fatigue, headaches, induration and myalgias   Associated symptoms: no diarrhea, no fever, no joint pain, no nausea, no shortness of breath, no sore throat, no URI and not vomiting     Past Medical History:  Diagnosis Date  . Esophageal spasm    "RX controlled" (10/20/2014)  . Kidney stones 05/2014   "passed it"  . OSA on CPAP     Patient Active Problem List   Diagnosis Date Noted  . History of adenomatous polyp of colon 02/05/2019  . Diabetes mellitus ([place]) 09/07/2018  . Type 2 diabetes mellitus with hyperglycemia ([place])  09/07/2018  . HTN (hypertension) 12/12/2017  . Hand injury, right, initial encounter 03/25/2017  . Morbid (severe) obesity due to excess calories ([place]) 04/11/2015  . Right hip pain 04/11/2015  . Pericardial effusion   . Dyspnea on exertion 10/20/2014  . Streptococcal sore throat 10/20/2014  . SOB (shortness of breath) 10/20/2014  . Pericarditis 10/20/2014  . Sleep apnea   . Esophageal spasm     Past Surgical History:  Procedure Laterality Date  . CARDIAC CATHETERIZATION  2011   "completely clean"       Home Medications    Prior to Admission medications   Medication Sig Start Date End Date Taking? Authorizing Provider  clindamycin (CLEOCIN) 300 MG capsule Take one cap PO Q8hr 02/28/19   Kandra Nicolas, MD  HYDROcodone-acetaminophen (NORCO/VICODIN) 5-325 MG tablet Take 1 tablet by mouth every 6 (six) hours as needed for moderate pain or severe pain. 02/28/19   Kandra Nicolas, MD  metFORMIN (GLUCOPHAGE-XR) 500 MG 24 hr tablet  07/29/18   [provider]  Multiple Vitamins-Minerals (MULTIVITAMIN WITH MINERALS) tablet Take 1 tablet by mouth daily.    [provider]  valACYclovir (VALTREX) 1000 MG tablet Take one tab PO Q8hr 02/28/19   Kandra Nicolas, MD  verapamil (CALAN) 120 MG tablet Take 120 mg by mouth daily.     [provider]  zolpidem (AMBIEN) 10 MG tablet Take 10 mg by mouth at bedtime as needed for sleep.  [provider]    Family History Family History  Problem Relation Age of Onset  . Diabetes Mellitus II Other        Grandmother, mother side  . Liver cancer Other        Grandmother, father side    Social History Social History   Tobacco Use  . Smoking status: Never Smoker  . Smokeless tobacco: Former Systems developer  . Tobacco comment: "quit chewing in the 1990's"  Substance Use Topics  . Alcohol use: Yes    Alcohol/week: 0.0 standard drinks    Comment: 10/20/2014 "might have a few drinks a few times/yr"  . Drug use: No      Allergies   Patient has no known allergies.   Review of Systems Review of Systems  Constitutional: Positive for activity change and fatigue. Negative for appetite change and fever.  HENT: Negative for sore throat.   Respiratory: Negative for shortness of breath.   Gastrointestinal: Positive for abdominal pain. Negative for abdominal distention, blood in stool, diarrhea, nausea and vomiting.  Musculoskeletal: Positive for myalgias. Negative for arthralgias.  Skin: Positive for color change and rash.  Neurological: Positive for headaches.  All other systems reviewed and are negative.    Physical Exam Triage Vital Signs ED Triage Vitals  Enc Vitals Group     BP 02/28/19 1222 (!) 150/93     Pulse Rate 02/28/19 1222 94     Resp 02/28/19 1222 (!) 94     Temp 02/28/19 1222 98.1 F (36.7 C)     Temp Source 02/28/19 1222 Oral     SpO2 02/28/19 1222 95 %     Weight 02/28/19 1223 (!) 333 lb (151 kg)     Height 02/28/19 1223 6\' 1"  (1.854 m)     Head Circumference --      Peak Flow --      Pain Score 02/28/19 1223 3     Pain Loc --      Pain Edu? --      Excl. in [place]? --    No data found.  Updated Vital Signs BP (!) 150/93 (BP Location: Right Arm)   Pulse 94   Temp 98.1 F (36.7 C) (Oral)   Resp (!) 94   Ht 6\' 1"  (1.854 m)   Wt (!) 151 kg   SpO2 95%   BMI 43.93 kg/m   Visual Acuity Right Eye Distance:   Left Eye Distance:   Bilateral Distance:    Right Eye Near:   Left Eye Near:    Bilateral Near:     Physical Exam Vitals signs reviewed.  Constitutional:      General: He is not in acute distress.    Appearance: He is obese.  HENT:     Head: Normocephalic.     Right Ear: External ear normal.     Left Ear: External ear normal.     Nose: Nose normal.     Mouth/Throat:     Pharynx: Oropharynx is clear.  Eyes:     Pupils: Pupils are equal, round, and reactive to light.  Cardiovascular:     Heart sounds: Normal heart sounds.  Pulmonary:     Breath sounds:  Normal breath sounds.  Abdominal:       Comments: Tenderness/induration superior aspect of umbilicus.  No fluctuance.  Distinct tenderness with deep palpation.  Palpation around umbilicus also causes pain.  Musculoskeletal:     Right lower leg: No edema.     Left  lower leg: No edema.  Lymphadenopathy:     Cervical: No cervical adenopathy.  Skin:    General: Skin is warm and dry.  Neurological:     Mental Status: He is alert.      UC Treatments / Results  Labs (all labs ordered are listed, but only abnormal results are displayed) Labs Reviewed  POCT CBC W AUTO DIFF ([facility]):  WBC 7.1; LY 34.4; MO 8.2; GR 57.4; Hgb 15.4; Platelets 182     EKG   Radiology No results found.  Procedures Procedures (including critical care time)  Medications Ordered in UC Medications - No data to display  Initial Impression / Assessment and Plan / UC Course  I have reviewed the triage vital signs and the nursing notes.  Pertinent labs & imaging results that were available during my care of the patient were reviewed by me and considered in my medical decision making (see chart for details).    Normal WBC reassuring. Onset of symptoms suggestive of herpes zoster; will begin empiric Valtrex. Will cover for staph with clindaymicin. Rx for Lortab (#10, no refill) Controlled Substance Prescriptions I have consulted the [place] Controlled Substances Registry for this patient, and feel the risk/benefit ratio today is favorable for proceeding with this prescription for a controlled substance.   Followup with Family Doctor if not improved in about 5 days.    Final Clinical Impressions(s) / UC Diagnoses   Final diagnoses:  Cellulitis of umbilicus     Discharge Instructions     May apply warm compress 2 to 3 times daily.  If symptoms become significantly worse during the night or over the weekend, proceed to the local emergency room.     ED Prescriptions    Medication Sig  Dispense Auth. Provider   clindamycin (CLEOCIN) 300 MG capsule Take one cap PO Q8hr 30 capsule Kandra Nicolas, MD   valACYclovir (VALTREX) 1000 MG tablet Take one tab PO Q8hr 21 tablet Kandra Nicolas, MD   HYDROcodone-acetaminophen (NORCO/VICODIN) 5-325 MG tablet Take 1 tablet by mouth every 6 (six) hours as needed for moderate pain or severe pain. 10 tablet Kandra Nicolas, MD        Kandra Nicolas, MD 03/01/19 [phone]

## 2019-03-12 DIAGNOSIS — R7302 Impaired glucose tolerance (oral): Secondary | ICD-10-CM | POA: Diagnosis not present

## 2019-03-12 DIAGNOSIS — E663 Overweight: Secondary | ICD-10-CM | POA: Diagnosis not present

## 2019-03-12 DIAGNOSIS — Z0000 Encounter for general adult medical examination without abnormal findings: Secondary | ICD-10-CM | POA: Diagnosis not present

## 2019-03-12 DIAGNOSIS — G4733 Obstructive sleep apnea (adult) (pediatric): Secondary | ICD-10-CM | POA: Diagnosis not present

## 2019-03-12 DIAGNOSIS — I10 Essential (primary) hypertension: Secondary | ICD-10-CM | POA: Diagnosis not present

## 2019-03-12 DIAGNOSIS — L821 Other seborrheic keratosis: Secondary | ICD-10-CM | POA: Diagnosis not present

## 2019-03-12 DIAGNOSIS — G44209 Tension-type headache, unspecified, not intractable: Secondary | ICD-10-CM | POA: Diagnosis not present

## 2019-03-12 MED FILL — VICTOZA 18 MG/3 ML INJECT P: 18 | 30 days supply | Qty: 9 | Fill #0

## 2019-03-12 MED FILL — ULTICARE PEN NDL 4MM 32G: 32G X 4 MM | 90 days supply | Qty: 100 | Fill #0

## 2019-03-15 MED FILL — metFORMIN HCL ER 500 MG TB2: 500 | 30 days supply | Qty: 120 | Fill #3

## 2019-03-25 ENCOUNTER — Ambulatory Visit (INDEPENDENT_AMBULATORY_CARE_PROVIDER_SITE_OTHER): Payer: 59 | Admitting: Gastroenterology

## 2019-03-25 ENCOUNTER — Encounter: Payer: Self-pay | Admitting: Gastroenterology

## 2019-03-25 ENCOUNTER — Other Ambulatory Visit: Payer: Self-pay

## 2019-03-25 ENCOUNTER — Other Ambulatory Visit (INDEPENDENT_AMBULATORY_CARE_PROVIDER_SITE_OTHER): Payer: 59

## 2019-03-25 VITALS — BP 132/80 | HR 97 | Temp 97.2°F | Ht 73.0 in | Wt 333.1 lb

## 2019-03-25 DIAGNOSIS — R1013 Epigastric pain: Secondary | ICD-10-CM

## 2019-03-25 DIAGNOSIS — R112 Nausea with vomiting, unspecified: Secondary | ICD-10-CM

## 2019-03-25 DIAGNOSIS — R1012 Left upper quadrant pain: Secondary | ICD-10-CM

## 2019-03-25 DIAGNOSIS — R634 Abnormal weight loss: Secondary | ICD-10-CM | POA: Diagnosis not present

## 2019-03-25 LAB — COMPREHENSIVE METABOLIC PANEL
ALT: 50 U/L (ref 0–53)
AST: 32 U/L (ref 0–37)
Albumin: 4.6 g/dL (ref 3.5–5.2)
Alkaline Phosphatase: 42 U/L (ref 39–117)
BUN: 6 mg/dL (ref 6–23)
CO2: 27 mEq/L (ref 19–32)
Calcium: 10.5 mg/dL (ref 8.4–10.5)
Chloride: 101 mEq/L (ref 96–112)
Creatinine, Ser: 0.93 mg/dL (ref 0.40–1.50)
GFR: 85.2 mL/min (ref >60.00)
Glucose, Bld: 101 mg/dL — ABNORMAL HIGH (ref 70–99)
Potassium: 4.1 mEq/L (ref 3.5–5.1)
Sodium: 136 mEq/L (ref 135–145)
Total Bilirubin: 0.8 mg/dL (ref 0.2–1.2)
Total Protein: 7.9 g/dL (ref 6.0–8.3)

## 2019-03-25 MED ORDER — PANTOPRAZOLE SODIUM 40 MG PO TBEC: 40.0000 mg | DELAYED_RELEASE_TABLET | Freq: Every day | ORAL | 3 refills | Status: DC

## 2019-03-25 MED FILL — PANTOPRAZOLE SOD DR 40 MG T: 40 | 90 days supply | Qty: 90 | Fill #0

## 2019-03-25 NOTE — Progress Notes (Signed)
Chief Complaint: Abdominal pain  Referring Provider:  Franki Monte, MD      ASSESSMENT AND PLAN;   #1. Epi pain with nausea. Nl CBC, CMP except for mildly abnormal ALT 03/12/2019 (care everywhere).  Could be related to medications.  #2. LUQ pain with wt loss, intermittent diarrhea.   #3. Mildly abn LFTs likely d/t fatty liver. R/O other associated causes.  Has poorly controlled diabetes (hemoglobin A1c 8.1 on 03/12/2019)  #3. H/O tubular adenomas.  Next colon due 10/2022.  Plan: - CT abdo/pelvis with p.o. and IV contrast. - Protonix 40mg  po qAM, 30, 2 refills. - EGD in 2 weeks - Stool for GI pathogens including C. difficile. - Continue zofran prn for now. - Once the above GI WU is complete, would proceed with liver WU.   HPI:    Benjamin Lee is a 53 y.o. 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  No vomiting.  Recently had cellulitis of abdominal wall, treated with clindamycin successfully.  His diabetes was previously under good control.  However, over the last few months, hemoglobin A1c has gone up.  This did prompt addition of Victoza.  The abdominal symptoms did get somewhat worse after Victoza.  He has been on metformin as well.  Has occasional heartburn but no odynophagia or dysphagia.  Has been having intermittent diarrhea and constipation with abdominal bloating more so recently.  No melena or hematochezia.  Denies any significant nonsteroidal use.  No sodas, alcohol, chewing gums chocolates or mints.  Had colonoscopy 11/11/2017 at [facility] -had tubular adenomas, next colon due 2024  No fever chills or night sweats.  Labs from 03/12/2019 reviewed -normal CMP except glucose 162, ALT 66.  Normal CBC with hemoglobin 14.5, platelet count 198K.  Hemoglobin A1c 8.1.  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 Past Medical History:  Diagnosis Date  . Diabetes ([place])   . Esophageal spasm    "RX controlled" (10/20/2014)  . History of colon polyps   . Kidney stones 05/2014   "passed it"  . Obesity   . OSA on CPAP     Past Surgical History:  Procedure Laterality Date  . CARDIAC CATHETERIZATION  2011   "completely clean"  . COLONOSCOPY  10/2017   [facility] in [place]  . NM RENAL LASIX ([facility] HX)      Family History  Problem Relation Age of Onset  . Diabetes Mellitus II Other        Grandmother, mother side  . Heart disease Maternal Grandfather   . Liver cancer Paternal Grandfather   . Colon cancer Neg Hx   . Esophageal cancer Neg Hx     Social History   Tobacco Use  . Smoking status: Never Smoker  . Smokeless tobacco: Former Systems developer  . Tobacco comment: "quit chewing in the 1990's"  Substance Use Topics  . Alcohol use: Yes    Alcohol/week: 0.0 standard drinks    Comment: 2 per week  . Drug use: No    Current Outpatient Medications  Medication Sig Dispense Refill  . HYDROcodone-acetaminophen (NORCO/VICODIN) 5-325 MG tablet Take 1 tablet by mouth every 6 (six) hours as needed for moderate pain or severe pain. 10 tablet 0  . Liraglutide (VICTOZA [place])  Inject 1.8 Units into the skin daily.    . metFORMIN (GLUCOPHAGE-XR) 500 MG 24 hr tablet 2,000 mg daily.     . Multiple Vitamins-Minerals (MULTIVITAMIN WITH MINERALS) tablet Take 1 tablet by mouth daily.    . verapamil (CALAN) 120 MG tablet Take 120 mg by mouth daily.     [place] Kitchen zolpidem (AMBIEN) 10 MG tablet Take 10 mg by mouth at bedtime as needed for sleep.     No current facility-administered medications for this visit.     No Known Allergies  Review of Systems:  Constitutional: Denies fever, chills, diaphoresis, appetite change and fatigue.  HEENT: Denies photophobia, eye pain, redness, hearing loss, ear pain, congestion, sore throat, rhinorrhea, sneezing, mouth sores,  neck pain, neck stiffness and tinnitus.   Respiratory: Denies SOB, DOE, cough, chest tightness,  and wheezing.   Cardiovascular: Denies chest pain, palpitations and leg swelling.  Genitourinary: Denies dysuria, urgency, frequency, hematuria, flank pain and difficulty urinating.  Musculoskeletal: Denies myalgias, back pain, joint swelling, arthralgias and gait problem.  Skin: No rash.  Neurological: Denies dizziness, seizures, syncope, weakness, light-headedness, numbness and headaches.  Hematological: Denies adenopathy. Easy bruising, personal or family bleeding history  Psychiatric/Behavioral: No anxiety or depression     Physical Exam:    BP 132/80   Pulse 97   Temp (!) 97.2 F (36.2 C)   Ht 6\' 1"  (1.854 m)   Wt (!) 333 lb 2 oz (151.1 kg)   BMI 43.95 kg/m  Filed Weights   03/25/19 0942  Weight: (!) 333 lb 2 oz (151.1 kg)   Constitutional:  Well-developed, in no acute distress. Psychiatric: Normal mood and affect. Behavior is normal. HEENT: Pupils normal.  Conjunctivae are normal. No scleral icterus. Neck supple.  Cardiovascular: Normal rate, regular rhythm. No edema Pulmonary/chest: Effort normal and breath sounds normal. No wheezing, rales or rhonchi. Abdominal: Soft, nondistended. Nontender. Bowel sounds active throughout. There are no masses palpable. No hepatomegaly. Rectal:  defered Neurological: Alert and oriented to person place and time. Skin: Skin is warm and dry. No rashes noted.  Data Reviewed: I have personally reviewed following labs and imaging studies  CBC: CBC Latest Ref Rng & Units 10/21/2014 10/20/2014 05/26/2014  WBC 4.0 - 10.5 K/uL 7.6 8.5 7.8  Hemoglobin 13.0 - 17.0 g/dL 15.0 15.9 15.0  Hematocrit 39.0 - 52.0 % 44.2 46.4 43.9  Platelets 150 - 400 K/uL 211 230 196    CMP: CMP Latest Ref Rng & Units 10/21/2014 10/20/2014 05/26/2014  Glucose 70 - 99 mg/dL 271(H) 146(H) 237(H)  BUN 6 - 23 mg/dL 15 13 12   Creatinine 0.50 - 1.35 mg/dL 1.07 1.00 1.10   Sodium 135 - 145 mmol/L 136 136 139  Potassium 3.5 - 5.1 mmol/L 4.3 4.1 3.9  Chloride 96 - 112 mmol/L 103 103 99  CO2 19 - 32 mmol/L 21 24 23   Calcium 8.4 - 10.5 mg/dL 9.5 9.5 9.9  Total Protein 6.0 - 8.3 g/dL 8.5(H) - 8.1  Total Bilirubin 0.3 - 1.2 mg/dL 0.6 - 0.4  Alkaline Phos 39 - 117 U/L 46 - 54  AST 0 - 37 U/L 28 - 25  ALT 0 - 53 U/L 43 - 38     Carmell Austria, MD 03/25/2019, 9:57 AM  Cc: Franki Monte, MD

## 2019-03-25 NOTE — Patient Instructions (Signed)
If you are age 52 or older, your body mass index should be between 23-30. Your Body mass index is 43.95 kg/m. If this is out of the aforementioned range listed, please consider follow up with your Primary Care Provider.  If you are age 56 or younger, your body mass index should be between 19-25. Your Body mass index is 43.95 kg/m. If this is out of the aformentioned range listed, please consider follow up with your Primary Care Provider.   To help prevent the possible spread of infection to our patients, communities, and staff; we will be implementing the following measures:  As of now we are not allowing any visitors/family members to accompany you to any upcoming appointments with [facility]. If you have any concerns about this please contact our office to discuss prior to the appointment.   You have been scheduled [facility] CT [facility] [place] [address] [address] [address] [facility]).   You are scheduled on 03/31/2019 at 9:00am. You should arrive 15 minutes prior to your appointment time for registration. Please follow the written instructions below on the day of your exam:  WARNING: IF YOU ARE ALLERGIC TO IODINE/X-RAY DYE, PLEASE NOTIFY RADIOLOGY IMMEDIATELY AT [phone]! YOU WILL BE GIVEN A 13 HOUR PREMEDICATION PREP.  1) Do not eat or drink anything after 5:00am (4 hours prior to your test) 2) You have been given 2 bottles of oral contrast to drink. The solution may taste better if refrigerated, but do NOT add ice or any other liquid to this solution. Shake well before drinking.    Drink 1 bottle of contrast @ 7:00am (2 hours prior to your exam)  Drink 1 bottle of contrast @ 8:00am (1 hour prior to your exam)  You may take any medications as prescribed with a small amount of water, if necessary. If you take any of the following medications: METFORMIN, GLUCOPHAGE, GLUCOVANCE, AVANDAMET, RIOMET, FORTAMET, [place]  MET, JANUMET, GLUMETZA or METAGLIP, you MAY be asked to HOLD this medication 48 hours AFTER the exam.  The purpose of you drinking the oral contrast is to aid in the visualization of your intestinal tract. The contrast solution may cause some diarrhea. Depending on your individual set of symptoms, you may also receive an intravenous injection of x-ray contrast/dye. Plan on being at [facility] for 30 minutes or longer, depending on the type of exam you are having performed.  This test typically takes 30-45 minutes to complete.  If you have any questions regarding your exam or if you need to reschedule, you may call the CT department at [phone] between the hours of 8:00 am and 5:00 pm, Monday-Friday.  ________________________________________________________________________  We have sent the following medications to your pharmacy for you to pick up at your convenience: Protonix 73m by mouth daily.  You have been scheduled for an endoscopy. Please follow written instructions given to you at your visit today. If you use inhalers (even only as needed), please bring them with you on the day of your procedure. Your physician has requested that you go to www.startemmi.com and enter the access code given to you at your visit today. This web site gives a general overview about your procedure. However, you should still follow specific instructions given to you by our office regarding your preparation for the procedure.  Your provider has requested that you go to the basement level for lab work at our [facility] ([address] [place] [address]) . Press "B" on the elevator. The lab is located at the first door on the left as you exit the elevator. You may go at whatever time is convienent for you. The current hours of operations are Monday- Friday 7:30am-4:30pm.  Thank you,  Dr. Jackquline Denmark

## 2019-03-26 ENCOUNTER — Encounter (HOSPITAL_BASED_OUTPATIENT_CLINIC_OR_DEPARTMENT_OTHER): Payer: Self-pay

## 2019-03-26 ENCOUNTER — Other Ambulatory Visit: Payer: 59

## 2019-03-26 ENCOUNTER — Ambulatory Visit (HOSPITAL_BASED_OUTPATIENT_CLINIC_OR_DEPARTMENT_OTHER)
Admission: RE | Admit: 2019-03-26 | Discharge: 2019-03-26 | Disposition: A | Discharge status: Home / Self Care | Payer: 59 | Source: Ambulatory Visit | Attending: Gastroenterology | Admitting: Gastroenterology

## 2019-03-26 DIAGNOSIS — R1013 Epigastric pain: Secondary | ICD-10-CM

## 2019-03-26 DIAGNOSIS — R634 Abnormal weight loss: Secondary | ICD-10-CM

## 2019-03-26 DIAGNOSIS — R1012 Left upper quadrant pain: Secondary | ICD-10-CM

## 2019-03-26 DIAGNOSIS — R112 Nausea with vomiting, unspecified: Secondary | ICD-10-CM | POA: Diagnosis not present

## 2019-03-26 DIAGNOSIS — R1110 Vomiting, unspecified: Secondary | ICD-10-CM | POA: Diagnosis not present

## 2019-03-26 MED ORDER — IOHEXOL 300 MG/ML  SOLN
100.0000 mL | Freq: Once | INTRAMUSCULAR | Status: AC | PRN
  Administered 2019-03-26: 11:00:00 100 mL via INTRAVENOUS

## 2019-03-29 LAB — GASTROINTESTINAL PATHOGEN PANEL PCR
C. difficile Tox A/B, PCR: NOT DETECTED
Campylobacter, PCR: NOT DETECTED
Cryptosporidium, PCR: NOT DETECTED
E coli (ETEC) LT/ST PCR: NOT DETECTED
E coli (STEC) stx1/stx2, PCR: NOT DETECTED
E coli 0157, PCR: NOT DETECTED
Giardia lamblia, PCR: NOT DETECTED
Norovirus, PCR: NOT DETECTED
Rotavirus A, PCR: NOT DETECTED
Salmonella, PCR: DETECTED — AB
Shigella, PCR: NOT DETECTED

## 2019-03-31 ENCOUNTER — Other Ambulatory Visit (HOSPITAL_BASED_OUTPATIENT_CLINIC_OR_DEPARTMENT_OTHER): Payer: 59

## 2019-03-31 MED FILL — levoFLOXacin 500 MG TABS: 500 | 7 days supply | Qty: 7 | Fill #0

## 2019-04-01 ENCOUNTER — Encounter: Payer: Self-pay | Admitting: Gastroenterology

## 2019-04-09 ENCOUNTER — Other Ambulatory Visit: Payer: Self-pay

## 2019-04-09 DIAGNOSIS — Z1159 Encounter for screening for other viral diseases: Secondary | ICD-10-CM

## 2019-04-09 MED FILL — metFORMIN HCL ER 500 MG TB2: 500 | 30 days supply | Qty: 120 | Fill #4

## 2019-04-12 MED FILL — VICTOZA 18 MG/3 ML INJECT P: 18 | 30 days supply | Qty: 9 | Fill #1

## 2019-04-13 DIAGNOSIS — Z1159 Encounter for screening for other viral diseases: Secondary | ICD-10-CM | POA: Diagnosis not present

## 2019-04-13 LAB — SARS CORONAVIRUS 2 (TAT 6-24 HRS): SARS Coronavirus 2: NEGATIVE

## 2019-04-14 ENCOUNTER — Telehealth: Payer: Self-pay

## 2019-04-14 NOTE — Telephone Encounter (Signed)
Covid-19 screening questions   Do you now or have you had a fever in the last 14 days?  Do you have any respiratory symptoms of shortness of breath or cough now or in the last 14 days?  Do you have any family members or close contacts with diagnosed or suspected Covid-19 in the past 14 days?  Have you been tested for Covid-19 and found to be positive?       

## 2019-04-14 NOTE — Telephone Encounter (Signed)
Pt responded "no" to all screening questions °

## 2019-04-15 ENCOUNTER — Ambulatory Visit (AMBULATORY_SURGERY_CENTER): Payer: 59 | Admitting: Gastroenterology

## 2019-04-15 ENCOUNTER — Other Ambulatory Visit: Payer: Self-pay

## 2019-04-15 ENCOUNTER — Encounter: Payer: Self-pay | Admitting: Gastroenterology

## 2019-04-15 ENCOUNTER — Other Ambulatory Visit: Payer: Self-pay | Admitting: Gastroenterology

## 2019-04-15 VITALS — BP 129/80 | HR 75 | Temp 98.3°F | Resp 16 | Ht 73.0 in | Wt 333.0 lb

## 2019-04-15 DIAGNOSIS — K2970 Gastritis, unspecified, without bleeding: Secondary | ICD-10-CM

## 2019-04-15 DIAGNOSIS — B9689 Other specified bacterial agents as the cause of diseases classified elsewhere: Secondary | ICD-10-CM | POA: Diagnosis not present

## 2019-04-15 DIAGNOSIS — K2950 Unspecified chronic gastritis without bleeding: Secondary | ICD-10-CM | POA: Diagnosis not present

## 2019-04-15 DIAGNOSIS — R1013 Epigastric pain: Secondary | ICD-10-CM

## 2019-04-15 MED ORDER — SODIUM CHLORIDE 0.9 % IV SOLN: 500.0000 mL | Freq: Once | INTRAVENOUS | Status: DC

## 2019-04-15 NOTE — Progress Notes (Signed)
PT taken to PACU. Monitors in place. VSS. Report given to RN. 

## 2019-04-15 NOTE — Patient Instructions (Signed)
HANDOUTS PROVIDED ON:  GASTRITIS AND GASTROPARESIS DIET  PLEASE FOLLOW THE STEP 1 RECOMMENDATIONS ON THE GASTROPARESIS DIET.  YOU MAY RESUME YOUR CURRENT MEDICATION SCHEDULE AND NEED TO CONTINUE THE [place].  NO IBUPROFEN, NAPROXEN, OR OTHER NSAIDS!!!!  THE BIOPSY COLLECTED IS BEING SENT TO PATHOLOGY.  THE RESULTS USUALLY TAKE 10-14 DAYS TO RECEIVE.  FOLLOW UP TO GI CLINIC IN 4 WEEKS.  YOU HAD AN ENDOSCOPIC PROCEDURE TODAY AT [facility]:   Refer to the procedure report that was given to you for any specific questions about what was found during the examination.  If the procedure report does not answer your questions, please call your gastroenterologist to clarify.  If you requested that your care partner not be given the details of your procedure findings, then the procedure report has been included in a sealed envelope for you to review at your convenience later.  YOU SHOULD EXPECT: Some feelings of bloating in the abdomen. Passage of more gas than usual.  Walking can help get rid of the air that was put into your GI tract during the procedure and reduce the bloating. If you had a lower endoscopy (such as a colonoscopy or flexible sigmoidoscopy) you may notice spotting of blood in your stool or on the toilet paper. If you underwent a bowel prep for your procedure, you may not have a normal bowel movement for a few days.  Please Note:  You might notice some irritation and congestion in your nose or some drainage.  This is from the oxygen used during your procedure.  There is no need for concern and it should clear up in a day or so.  SYMPTOMS TO REPORT IMMEDIATELY:   Following upper endoscopy (EGD)  Vomiting of blood or coffee ground material  New chest pain or pain under the shoulder blades  Painful or persistently difficult swallowing  New shortness of breath  Fever of 100F or higher  Black, tarry-looking stools  For urgent or emergent issues, a gastroenterologist can  be reached at any hour by calling [phone].   DIET:  We do recommend a small meal at first, but then you may proceed to your regular diet.  Drink plenty of fluids but you should avoid alcoholic beverages for 24 hours.  ACTIVITY:  You should plan to take it easy for the rest of today and you should NOT DRIVE or use heavy machinery until tomorrow (because of the sedation medicines used during the test).    FOLLOW UP: Our staff will call the number listed on your records 48-72 hours following your procedure to check on you and address any questions or concerns that you may have regarding the information given to you following your procedure. If we do not reach you, we will leave a message.  We will attempt to reach you two times.  During this call, we will ask if you have developed any symptoms of COVID 19. If you develop any symptoms (ie: fever, flu-like symptoms, shortness of breath, cough etc.) before then, please call [phone].  If you test positive for Covid 19 in the 2 weeks post procedure, please call and report this information to [place].    If any biopsies were taken you will be contacted by phone or by letter within the next 1-3 weeks.  Please call us at [phone] if you have not heard about the biopsies in 3 weeks.    SIGNATURES/CONFIDENTIALITY: You and/or your care partner have signed paperwork which will be  entered into your electronic medical record.  These signatures attest to the fact that that the information above on your After Visit Summary has been reviewed and is understood.  Full responsibility of the confidentiality of this discharge information lies with you and/or your care-partner.

## 2019-04-15 NOTE — Op Note (Signed)
[place] Patient Name: Benjamin Lee Procedure Date: 04/15/2019 9:59 AM MRN: [id number] Endoscopist: Jackquline Denmark , MD Age: 53 Referring MD:  Date of Birth: 05-Aug-1965 Gender: Male Account #: [id number] Procedure:                Upper GI endoscopy Indications:              Epigastric abdominal pain with nausea. Medicines:                Monitored Anesthesia Care Procedure:                Pre-Anesthesia Assessment:                           - Prior to the procedure, a History and Physical                            was performed, and patient medications and                            allergies were reviewed. The patient's tolerance of                            previous anesthesia was also reviewed. The risks                            and benefits of the procedure and the sedation                            options and risks were discussed with the patient.                            All questions were answered, and informed consent                            was obtained. Prior Anticoagulants: The patient has                            taken no previous anticoagulant or antiplatelet                            agents. ASA Grade Assessment: II - A patient with                            mild systemic disease. After reviewing the risks                            and benefits, the patient was deemed in                            satisfactory condition to undergo the procedure.                           After obtaining informed consent, the endoscope was  passed under direct vision. Throughout the                            procedure, the patient's blood pressure, pulse, and                            oxygen saturations were monitored continuously. The                            Endoscope was introduced through the mouth, and                            advanced to the second part of duodenum. The upper                            GI endoscopy was  accomplished without difficulty.                            The patient tolerated the procedure well. Scope In: Scope Out: Findings:                 The examined esophagus was normal with Z-line at 40                            cm. Examined by NBI.                           Limited examination of the stomach due to                            significant retained food. Localized mild                            inflammation characterized by erythema was found in                            the gastric antrum. Biopsies were taken with a cold                            forceps for histology. No mechanical outlet                            obstruction. The scope could easily be passed to                            the duodenum.                           The examined duodenum was normal. Biopsies for                            histology were taken with a cold forceps for                            evaluation of  celiac disease. Complications:            No immediate complications. Estimated Blood Loss:     Estimated blood loss: none. Impression:               -Retained food without gastric outlet obstruction.                           -Mild gastritis. Recommendation:           - Patient has a contact number available for                            emergencies. The signs and symptoms of potential                            delayed complications were discussed with the                            patient. Return to normal activities tomorrow.                            Written discharge instructions were provided to the                            patient.                           - Given a trial of gastroparesis diet.                           - Continue present medications. Continue Protonix.                           - Await pathology results.                           - No ibuprofen, naproxen, or other non-steroidal                            anti-inflammatory drugs.                            - Return to GI clinic in 4 weeks. If still with                            problems, solid-phase gastric emptying scan. Jackquline Denmark, MD 04/15/2019 10:18:56 AM This report has been signed electronically.

## 2019-04-15 NOTE — Progress Notes (Signed)
Called to room to assist during endoscopic procedure.  Patient ID and intended procedure confirmed with present staff. Received instructions for my participation in the procedure from the performing physician.  

## 2019-04-19 ENCOUNTER — Telehealth: Payer: Self-pay

## 2019-04-19 NOTE — Telephone Encounter (Signed)
  Follow up Call-  Call back number 04/15/2019  Post procedure Call Back phone  # [phone]  Permission to leave phone message Yes  Some recent data might be hidden     Patient questions:  Do you have a fever, pain , or abdominal swelling? No. Pain Score  0 *  Have you tolerated food without any problems? Yes.    Have you been able to return to your normal activities? Yes.    Do you have any questions about your discharge instructions: Diet   No. Medications  No. Follow up visit  No.  Do you have questions or concerns about your Care? No.  Actions: * If pain score is 4 or above: No action needed, pain <4. 1. Have you developed a fever since your procedure? no  2.   Have you had an respiratory symptoms (SOB or cough) since your procedure? no  3.   Have you tested positive for COVID 19 since your procedure no  4.   Have you had any family members/close contacts diagnosed with the COVID 19 since your procedure?  no   If yes to any of these questions please route to Joylene John, RN and Alphonsa Gin, Therapist, sports.

## 2019-04-19 NOTE — Telephone Encounter (Signed)
Left message on follow up call. 

## 2019-04-25 ENCOUNTER — Encounter: Payer: Self-pay | Admitting: Gastroenterology

## 2019-04-26 DIAGNOSIS — Z9989 Dependence on other enabling machines and devices: Secondary | ICD-10-CM | POA: Diagnosis not present

## 2019-04-26 DIAGNOSIS — F5089 Other specified eating disorder: Secondary | ICD-10-CM | POA: Diagnosis not present

## 2019-04-26 DIAGNOSIS — R030 Elevated blood-pressure reading, without diagnosis of hypertension: Secondary | ICD-10-CM | POA: Diagnosis not present

## 2019-04-26 DIAGNOSIS — Z6841 Body Mass Index (BMI) 40.0 and over, adult: Secondary | ICD-10-CM | POA: Diagnosis not present

## 2019-04-26 DIAGNOSIS — K760 Fatty (change of) liver, not elsewhere classified: Secondary | ICD-10-CM | POA: Diagnosis not present

## 2019-04-26 DIAGNOSIS — E1165 Type 2 diabetes mellitus with hyperglycemia: Secondary | ICD-10-CM | POA: Diagnosis not present

## 2019-04-26 DIAGNOSIS — G4733 Obstructive sleep apnea (adult) (pediatric): Secondary | ICD-10-CM | POA: Diagnosis not present

## 2019-04-27 DIAGNOSIS — K3184 Gastroparesis: Secondary | ICD-10-CM | POA: Diagnosis not present

## 2019-04-27 DIAGNOSIS — E1169 Type 2 diabetes mellitus with other specified complication: Secondary | ICD-10-CM | POA: Diagnosis not present

## 2019-04-27 DIAGNOSIS — Z9989 Dependence on other enabling machines and devices: Secondary | ICD-10-CM | POA: Diagnosis not present

## 2019-04-27 DIAGNOSIS — I10 Essential (primary) hypertension: Secondary | ICD-10-CM | POA: Diagnosis not present

## 2019-04-27 DIAGNOSIS — Z6841 Body Mass Index (BMI) 40.0 and over, adult: Secondary | ICD-10-CM | POA: Diagnosis not present

## 2019-04-27 DIAGNOSIS — E785 Hyperlipidemia, unspecified: Secondary | ICD-10-CM | POA: Diagnosis not present

## 2019-04-27 DIAGNOSIS — G4733 Obstructive sleep apnea (adult) (pediatric): Secondary | ICD-10-CM | POA: Diagnosis not present

## 2019-04-30 MED FILL — QSYMIA 3.75 MG-23 MG CAP: 3.75-23 | 14 days supply | Qty: 14 | Fill #0

## 2019-05-04 MED FILL — metFORMIN HCL ER 500 MG TB2: 500 | 30 days supply | Qty: 120 | Fill #5

## 2019-05-11 ENCOUNTER — Other Ambulatory Visit: Payer: Self-pay

## 2019-05-11 ENCOUNTER — Encounter: Payer: Self-pay | Admitting: Gastroenterology

## 2019-05-11 ENCOUNTER — Ambulatory Visit (INDEPENDENT_AMBULATORY_CARE_PROVIDER_SITE_OTHER): Payer: 59 | Admitting: Gastroenterology

## 2019-05-11 VITALS — BP 110/60 | HR 85 | Temp 97.9°F | Ht 73.0 in | Wt 325.4 lb

## 2019-05-11 DIAGNOSIS — R1013 Epigastric pain: Secondary | ICD-10-CM

## 2019-05-11 MED ORDER — PANTOPRAZOLE SODIUM 40 MG PO TBEC: 40.0000 mg | DELAYED_RELEASE_TABLET | Freq: Every day | ORAL | 11 refills | Status: AC

## 2019-05-11 NOTE — Progress Notes (Signed)
Chief Complaint: Abdominal pain  Referring Provider:  Franki Monte, MD      ASSESSMENT AND PLAN;   #1. Epi pain with nausea (resolved). Nl CBC, CMP. EGD 04/2019 did showed retained food without outlet obstruction. Neg Bx for HP/celiac.  Would like to hold off on GES.  #2. LUQ pain with wt loss, intermittent diarrhea (resolved)  #3. Fatty liver (nl LFTs). R/O other associated causes.  Has poorly controlled diabetes (hemoglobin A1c 8.1 on 03/12/2019)  #3. H/O tubular adenomas.  Next colon due 10/2022.  Plan: - Protonix 40mg  po qAM, 30, 11refills - FU in 6 months. - Call if with any problems. - Continue exercising.   HPI:    Benjamin Lee is a 53 y.o. 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  Doing much better.  No GI complaints.  LFTs are normal.  He has started walking almost every day or using treadmill.  EGD did show retained food in the stomach without outlet obstruction.  Biopsies were negative.  He is totally asymptomatic.  Diabetes under good control now.  We would like to hold off on solid-phase gastric emptying scan at the present time.    Had colonoscopy 11/11/2017 at [facility] -had tubular adenomas, next colon due 2024  No fever chills or night sweats.  Labs from 03/12/2019 reviewed -normal CMP except glucose 162, ALT 66.  Normal CBC with hemoglobin 14.5, platelet count 198K.  Hemoglobin A1c 8.1.  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 Past Medical History:  Diagnosis Date  . Diabetes ([place])   . Esophageal spasm    "RX controlled" (10/20/2014)  . History of colon polyps   . Kidney stones 05/2014   "passed it"  . Obesity   . OSA on CPAP     Past Surgical History:  Procedure Laterality Date  . CARDIAC CATHETERIZATION  2011   "completely clean"  . COLONOSCOPY  10/2017   [facility] in [place]  . NM RENAL LASIX ([facility] HX)      Family History  Problem Relation Age of Onset  . Diabetes  Mellitus II Other        Grandmother, mother side  . Heart disease Maternal Grandfather   . Liver cancer Paternal Grandfather   . Colon cancer Neg Hx   . Esophageal cancer Neg Hx     Social History   Tobacco Use  . Smoking status: Never Smoker  . Smokeless tobacco: Former Systems developer  . Tobacco comment: "quit chewing in the 1990's"  Substance Use Topics  . Alcohol use: Yes    Alcohol/week: 0.0 standard drinks    Comment: 2 per week  . Drug use: No    Current Outpatient Medications  Medication Sig Dispense Refill  . Liraglutide (VICTOZA [place]) Inject 1.8 Units into the skin daily.    . metFORMIN (GLUCOPHAGE-XR) 500 MG 24 hr tablet 2,000 mg daily.     . Multiple Vitamins-Minerals (MULTIVITAMIN WITH MINERALS) tablet Take 1 tablet by mouth daily.    . pantoprazole (PROTONIX) 40 MG tablet Take 1 tablet (40 mg total) by mouth daily. 90 tablet 3  . verapamil (CALAN) 120 MG tablet Take 120 mg by mouth daily.     Marland Kitchen zolpidem (AMBIEN) 10 MG tablet Take 10 mg by mouth at bedtime as needed for sleep.    Marland Kitchen HYDROcodone-acetaminophen (NORCO/VICODIN) 5-325 MG tablet Take 1 tablet by mouth every 6 (six) hours  as needed for moderate pain or severe pain. (Patient not taking: Reported on 05/11/2019) 10 tablet 0   No current facility-administered medications for this visit.     No Known Allergies  Review of Systems:  neg     Physical Exam:    BP 110/60   Pulse 85   Temp 97.9 F (36.6 C)   Ht 6\' 1"  (1.854 m)   Wt (!) 325 lb 6 oz (147.6 kg)   BMI 42.93 kg/m  Filed Weights   05/11/19 1343  Weight: (!) 325 lb 6 oz (147.6 kg)   Constitutional:  Well-developed, in no acute distress. Psychiatric: Normal mood and affect. Behavior is normal Abdominal: Soft, nondistended. Nontender. Bowel sounds active throughout. There are no masses palpable. No hepatomegaly. Rectal:  defered Neurological: Alert and oriented to person place and time. Skin: Skin is warm and dry. No rashes noted.  Data Reviewed: I  have personally reviewed following labs and imaging studies  CBC: CBC Latest Ref Rng & Units 10/21/2014 10/20/2014 05/26/2014  WBC 4.0 - 10.5 K/uL 7.6 8.5 7.8  Hemoglobin 13.0 - 17.0 g/dL 15.0 15.9 15.0  Hematocrit 39.0 - 52.0 % 44.2 46.4 43.9  Platelets 150 - 400 K/uL 211 230 196    CMP: CMP Latest Ref Rng & Units 03/25/2019 10/21/2014 10/20/2014  Glucose 70 - 99 mg/dL 101(H) 271(H) 146(H)  BUN 6 - 23 mg/dL 6 15 13   Creatinine 0.40 - 1.50 mg/dL 0.93 1.07 1.00  Sodium 135 - 145 mEq/L 136 136 136  Potassium 3.5 - 5.1 mEq/L 4.1 4.3 4.1  Chloride 96 - 112 mEq/L 101 103 103  CO2 19 - 32 mEq/L 27 21 24   Calcium 8.4 - 10.5 mg/dL 10.5 9.5 9.5  Total Protein 6.0 - 8.3 g/dL 7.9 8.5(H) -  Total Bilirubin 0.2 - 1.2 mg/dL 0.8 0.6 -  Alkaline Phos 39 - 117 U/L 42 46 -  AST 0 - 37 U/L 32 28 -  ALT 0 - 53 U/L 50 43 -     Carmell Austria, MD 05/11/2019, 2:17 PM  Cc: Franki Monte, MD

## 2019-05-11 NOTE — Patient Instructions (Signed)
If you are age 52 or older, your body mass index should be between 23-30. Your Body mass index is 42.93 kg/m. If this is out of the aforementioned range listed, please consider follow up with your Primary Care Provider.  If you are age 52 or younger, your body mass index should be between 19-25. Your Body mass index is 42.93 kg/m. If this is out of the aformentioned range listed, please consider follow up with your Primary Care Provider.   We have sent the following medications to your pharmacy for you to pick up at your convenience: Protonix  Follow up in 6 months.   Thank you,  Dr. Jackquline Denmark

## 2019-05-12 MED FILL — VICTOZA 18 MG/3 ML INJECT P: 18 | 30 days supply | Qty: 9 | Fill #2

## 2019-05-31 MED FILL — METFORMIN HCL ER 500 MG TB2: 500 | 30 days supply | Qty: 120 | Fill #6

## 2019-06-04 MED FILL — VERAPAMIL HCL 120 MG TABS: 120 | 30 days supply | Qty: 30 | Fill #0

## 2019-06-07 MED FILL — VICTOZA 18 MG/3 ML INJECT P: 18 | 30 days supply | Qty: 9 | Fill #3

## 2019-06-07 MED FILL — ULTICARE PEN NDL 4MM 32G: 32G X 4 MM | 90 days supply | Qty: 100 | Fill #1

## 2019-06-14 DIAGNOSIS — M531 Cervicobrachial syndrome: Secondary | ICD-10-CM | POA: Diagnosis not present

## 2019-06-14 DIAGNOSIS — M9904 Segmental and somatic dysfunction of sacral region: Secondary | ICD-10-CM | POA: Diagnosis not present

## 2019-06-14 DIAGNOSIS — M9903 Segmental and somatic dysfunction of lumbar region: Secondary | ICD-10-CM | POA: Diagnosis not present

## 2019-06-14 DIAGNOSIS — M9902 Segmental and somatic dysfunction of thoracic region: Secondary | ICD-10-CM | POA: Diagnosis not present

## 2019-06-14 DIAGNOSIS — M5137 Other intervertebral disc degeneration, lumbosacral region: Secondary | ICD-10-CM | POA: Diagnosis not present

## 2019-06-14 DIAGNOSIS — M5032 Other cervical disc degeneration, mid-cervical region, unspecified level: Secondary | ICD-10-CM | POA: Diagnosis not present

## 2019-06-14 DIAGNOSIS — M791 Myalgia, unspecified site: Secondary | ICD-10-CM | POA: Diagnosis not present

## 2019-06-14 DIAGNOSIS — M9901 Segmental and somatic dysfunction of cervical region: Secondary | ICD-10-CM | POA: Diagnosis not present

## 2019-06-21 MED FILL — PANTOPRAZOLE SOD DR 40 MG T: 40 | 90 days supply | Qty: 90 | Fill #1

## 2019-06-29 MED FILL — VERAPAMIL HCL 120 MG TABS: 120 | 30 days supply | Qty: 30 | Fill #1

## 2019-07-09 MED FILL — VICTOZA 18 MG/3 ML INJECT P: 18 | 30 days supply | Qty: 9 | Fill #4

## 2019-07-27 ENCOUNTER — Ambulatory Visit (INDEPENDENT_AMBULATORY_CARE_PROVIDER_SITE_OTHER): Payer: 59 | Admitting: Family Medicine

## 2019-07-27 ENCOUNTER — Other Ambulatory Visit: Payer: Self-pay

## 2019-07-27 ENCOUNTER — Encounter: Payer: Self-pay | Admitting: Family Medicine

## 2019-07-27 ENCOUNTER — Ambulatory Visit: Payer: Self-pay

## 2019-07-27 VITALS — BP 151/87 | HR 87 | Ht 73.0 in

## 2019-07-27 DIAGNOSIS — M25561 Pain in right knee: Secondary | ICD-10-CM

## 2019-07-27 DIAGNOSIS — M23205 Derangement of unspecified medial meniscus due to old tear or injury, unspecified knee: Secondary | ICD-10-CM | POA: Insufficient documentation

## 2019-07-27 MED ORDER — PREDNISONE 5 MG PO TABS: ORAL_TABLET | ORAL | 0 refills | Status: AC

## 2019-07-27 MED FILL — predniSONE 5 MG TABS: 5 | 6 days supply | Qty: 21 | Fill #0

## 2019-07-27 NOTE — Progress Notes (Signed)
Benjamin Lee - 54 y.o. male MRN [id number]  Date of birth: 08/23/1965  SUBJECTIVE:  Including CC & ROS.  Chief Complaint  Patient presents with  . Knee Pain    right knee x 2 weeks    Benjamin Lee is a 54 y.o. male that is presenting with acute right knee pain.  The pain is gotten worse over the past 2 weeks.  Denies any history of similar symptoms.  Has tried ibuprofen and other medications with limited improvement.  No mechanical symptoms.  Seems to be worse with the course the day.  Seems to be more constant.   Review of Systems See HPI   HISTORY: Past Medical, Surgical, Social, and Family History Reviewed & Updated per EMR.   Pertinent Historical Findings include:  Past Medical History:  Diagnosis Date  . Diabetes ([place])   . Esophageal spasm    "RX controlled" (10/20/2014)  . History of colon polyps   . Kidney stones 05/2014   "passed it"  . Obesity   . OSA on CPAP     Past Surgical History:  Procedure Laterality Date  . CARDIAC CATHETERIZATION  2011   "completely clean"  . COLONOSCOPY  10/2017   [facility] in [place]  . NM RENAL LASIX ([facility] HX)      No Known Allergies  Family History  Problem Relation Age of Onset  . Diabetes Mellitus II Other        Grandmother, mother side  . Heart disease Maternal Grandfather   . Liver cancer Paternal Grandfather   . Colon cancer Neg Hx   . Esophageal cancer Neg Hx      Social History   Socioeconomic History  . Marital status: Married    Spouse name: Not on file  . Number of children: Not on file  . Years of education: Not on file  . Highest education level: Not on file  Occupational History  . Not on file  Tobacco Use  . Smoking status: Never Smoker  . Smokeless tobacco: Former Systems developer  . Tobacco comment: "quit chewing in the 1990's"  Substance and Sexual Activity  . Alcohol use: Yes    Alcohol/week: 0.0 standard drinks    Comment: 2 per week  . Drug use: No  . Sexual activity: Yes  Other  Topics Concern  . Not on file  Social History Narrative  . Not on file   Social Determinants of Health   Financial Resource Strain:   . Difficulty of Paying Living Expenses: Not on file  Food Insecurity:   . Worried About Charity fundraiser in the Last Year: Not on file  . Ran Out of Food in the Last Year: Not on file  Transportation Needs:   . Lack of Transportation (Medical): Not on file  . Lack of Transportation (Non-Medical): Not on file  Physical Activity:   . Days of Exercise per Week: Not on file  . Minutes of Exercise per Session: Not on file  Stress:   . Feeling of Stress : Not on file  Social Connections:   . Frequency of Communication with Friends and Family: Not on file  . Frequency of Social Gatherings with Friends and Family: Not on file  . Attends Religious Services: Not on file  . Active Member of Clubs or Organizations: Not on file  . Attends Archivist Meetings: Not on file  . Marital Status: Not on file  Intimate Partner Violence:   . Fear  of Current or Ex-Partner: Not on file  . Emotionally Abused: Not on file  . Physically Abused: Not on file  . Sexually Abused: Not on file     PHYSICAL EXAM:  VS: BP (!) 151/87   Pulse 87   Ht 6\' 1"  (1.854 m)   BMI 42.93 kg/m  Physical Exam Gen: NAD, alert, cooperative with exam, well-appearing ENT: normal lips, normal nasal mucosa,  Eye: normal EOM, normal conjunctiva and lids Skin: no rashes, no areas of induration  Neuro: normal tone, normal sensation to touch Psych:  normal insight, alert and oriented MSK:  Right knee: No obvious effusion. No instability with valgus or varus stress testing. Some pain with McMurray's test. Neurovascular intact  Limited ultrasound: Right knee:  Mild effusion. Normal-appearing quadriceps tendon. Mild joint space narrowing of the medial joint line. Normal-appearing lateral joint line and meniscus. Mild Baker's cyst in the posterior compartment.  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  May have component of patellofemoral syndrome as well. -Hinged knee brace. -Prednisone. -Provided samples of Pennsaid. -Counseled on home exercise therapy and supportive care. - Could consider injection

## 2019-07-27 NOTE — Patient Instructions (Signed)
Good to see you Please try the brace   Please try the exercises  Please try the rub on medicine  Please send me a message in MyChart with any questions or updates.  Please see me back in 4 weeks or sooner if needed.   --Dr. Raeford Razor

## 2019-07-27 NOTE — Assessment & Plan Note (Signed)
Acute and new.  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  May have component of patellofemoral syndrome as well. -Hinged knee brace. -Prednisone. -Provided samples of Pennsaid. -Counseled on home exercise therapy and supportive care. - Could consider injection

## 2019-07-27 NOTE — Progress Notes (Signed)
Medication Samples have been provided to the patient.  Drug name: Pennsaid       Strength: 2%        Qty: 2 Boxes  [id number]:[id number]  Exp.Date: 09/2019  Dosing instructions: Use a pea size amount and rub gently.  The patient has been instructed regarding the correct time, dose, and frequency of taking this medication, including desired effects and most common side effects.   Sherrie George, [place] 11:55 AM 07/27/2019

## 2019-07-30 DIAGNOSIS — G4733 Obstructive sleep apnea (adult) (pediatric): Secondary | ICD-10-CM | POA: Diagnosis not present

## 2019-07-30 DIAGNOSIS — E785 Hyperlipidemia, unspecified: Secondary | ICD-10-CM | POA: Diagnosis not present

## 2019-07-30 DIAGNOSIS — K219 Gastro-esophageal reflux disease without esophagitis: Secondary | ICD-10-CM | POA: Diagnosis not present

## 2019-07-30 DIAGNOSIS — M1711 Unilateral primary osteoarthritis, right knee: Secondary | ICD-10-CM | POA: Diagnosis not present

## 2019-07-30 DIAGNOSIS — E1169 Type 2 diabetes mellitus with other specified complication: Secondary | ICD-10-CM | POA: Diagnosis not present

## 2019-07-30 DIAGNOSIS — Z9989 Dependence on other enabling machines and devices: Secondary | ICD-10-CM | POA: Diagnosis not present

## 2019-07-30 DIAGNOSIS — I10 Essential (primary) hypertension: Secondary | ICD-10-CM | POA: Diagnosis not present

## 2019-07-30 DIAGNOSIS — E559 Vitamin D deficiency, unspecified: Secondary | ICD-10-CM | POA: Diagnosis not present

## 2019-07-30 MED FILL — VERAPAMIL HCL 120 MG TABS: 120 | 30 days supply | Qty: 30 | Fill #2

## 2019-08-05 MED FILL — VICTOZA 18 MG/3 ML INJECT P: 18 | 30 days supply | Qty: 9 | Fill #5

## 2019-08-30 MED FILL — metFORMIN HCL ER 500 MG TB2: 500 | 30 days supply | Qty: 120 | Fill #7

## 2019-08-30 MED FILL — VERAPAMIL HCL 120 MG TABS: 120 | 90 days supply | Qty: 90 | Fill #0

## 2019-09-01 MED FILL — VICTOZA 18 MG/3 ML INJECT P: 18 | 30 days supply | Qty: 9 | Fill #6

## 2019-09-02 ENCOUNTER — Encounter: Payer: Self-pay | Admitting: Family Medicine

## 2019-09-02 ENCOUNTER — Ambulatory Visit (INDEPENDENT_AMBULATORY_CARE_PROVIDER_SITE_OTHER): Payer: 59 | Admitting: Family Medicine

## 2019-09-02 ENCOUNTER — Other Ambulatory Visit: Payer: Self-pay

## 2019-09-02 ENCOUNTER — Ambulatory Visit: Payer: Self-pay

## 2019-09-02 VITALS — BP 147/89 | HR 88 | Ht 73.0 in

## 2019-09-02 DIAGNOSIS — M23203 Derangement of unspecified medial meniscus due to old tear or injury, right knee: Secondary | ICD-10-CM

## 2019-09-02 MED ORDER — TRIAMCINOLONE ACETONIDE 40 MG/ML IJ SUSP
40.0000 mg | Freq: Once | INTRAMUSCULAR | Status: AC
  Administered 2019-09-02: 09:00:00 40 mg via INTRA_ARTICULAR

## 2019-09-02 NOTE — Assessment & Plan Note (Signed)
Twisting most mechanism of worsening of his pain.  Likely exacerbate any underlying degenerative meniscus or joint line disease. -Injection. -Can continue hinged knee brace as needed. -Provided crutches. -X-ray. -If no improvement can consider physical therapy or further imaging.

## 2019-09-02 NOTE — Progress Notes (Signed)
Benjamin Lee - 54 y.o. male MRN [id number]  Date of birth: 21-May-1966  SUBJECTIVE:  Including CC & ROS.  Chief Complaint  Patient presents with  . Knee Pain    right knee    [name] is a 54 y.o. male that is presenting with acute on chronic right knee pain.  He reported doing well until recently he was getting into his truck and twisted his knee.  The pain is worse with ambulation.  Occurs with any weightbearing.  Seems to be localized to the medial aspect of the knee.  Denies any other instances of pain.  No mechanical symptoms.   Review of Systems See HPI   HISTORY: Past Medical, Surgical, Social, and Family History Reviewed & Updated per EMR.   Pertinent Historical Findings include:  Past Medical History:  Diagnosis Date  . Diabetes ([place])   . Esophageal spasm    "RX controlled" (10/20/2014)  . History of colon polyps   . Kidney stones 05/2014   "passed it"  . Obesity   . OSA on CPAP     Past Surgical History:  Procedure Laterality Date  . CARDIAC CATHETERIZATION  2011   "completely clean"  . COLONOSCOPY  10/2017   [facility] in [place]  . NM RENAL LASIX ([facility] HX)      Family History  Problem Relation Age of Onset  . Diabetes Mellitus II Other        Grandmother, mother side  . Heart disease Maternal Grandfather   . Liver cancer Paternal Grandfather   . Colon cancer Neg Hx   . Esophageal cancer Neg Hx     Social History   Socioeconomic History  . Marital status: Married    Spouse name: Not on file  . Number of children: Not on file  . Years of education: Not on file  . Highest education level: Not on file  Occupational History  . Not on file  Tobacco Use  . Smoking status: Never Smoker  . Smokeless tobacco: Former Systems developer  . Tobacco comment: "quit chewing in the 1990's"  Substance and Sexual Activity  . Alcohol use: Yes    Alcohol/week: 0.0 standard drinks    Comment: 2 per week  . Drug use: No  . Sexual activity: Yes  Other  Topics Concern  . Not on file  Social History Narrative  . Not on file   Social Determinants of Health   Financial Resource Strain:   . Difficulty of Paying Living Expenses: Not on file  Food Insecurity:   . Worried About Charity fundraiser in the Last Year: Not on file  . Ran Out of Food in the Last Year: Not on file  Transportation Needs:   . Lack of Transportation (Medical): Not on file  . Lack of Transportation (Non-Medical): Not on file  Physical Activity:   . Days of Exercise per Week: Not on file  . Minutes of Exercise per Session: Not on file  Stress:   . Feeling of Stress : Not on file  Social Connections:   . Frequency of Communication with Friends and Family: Not on file  . Frequency of Social Gatherings with Friends and Family: Not on file  . Attends Religious Services: Not on file  . Active Member of Clubs or Organizations: Not on file  . Attends Archivist Meetings: Not on file  . Marital Status: Not on file  Intimate Partner Violence:   . Fear of Current  or Ex-Partner: Not on file  . Emotionally Abused: Not on file  . Physically Abused: Not on file  . Sexually Abused: Not on file     PHYSICAL EXAM:  VS: BP (!) 147/89   Pulse 88   Ht 6\' 1"  (1.854 m)   BMI 42.93 kg/m  Physical Exam Gen: NAD, alert, cooperative with exam, well-appearing MSK:  Right knee: No obvious effusion. Normal passive range of motion. No instability. Tenderness to palpation of the medial joint line. Neurovascularly intact    Aspiration/Injection Procedure Note ESKER AGARD January 04, 1966  Procedure: Injection Indications: Right knee pain  Procedure Details Consent: Risks of procedure as well as the alternatives and risks of each were explained to the (patient/caregiver).  Consent for procedure obtained. Time Out: Verified patient identification, verified procedure, site/side was marked, verified correct patient position, special equipment/implants available,  medications/allergies/relevent history reviewed, required imaging and test results available.  Performed.  The area was cleaned with iodine and alcohol swabs.    The right knee superior lateral suprapatellar pouch was injected using 1 cc's of 40 mg Kenalog and 4 cc's of 0.25% bupivacaine with a 21 2" needle.  Ultrasound was used. Images were obtained in long views showing the injection.     A sterile dressing was applied.  Patient did tolerate procedure well.    ASSESSMENT & PLAN:   Degenerative tear of medial meniscus Twisting most mechanism of worsening of his pain.  Likely exacerbate any underlying degenerative meniscus or joint line disease. -Injection. -Can continue hinged knee brace as needed. -Provided crutches. -X-ray. -If no improvement can consider physical therapy or further imaging.

## 2019-09-02 NOTE — Patient Instructions (Signed)
Good to see you Please try ice  Please use the brace as needed  Please use the crutches for the next couple of days to help with walking if needed   I will call with the results from today Please send me a message in MyChart with any questions or updates.  Please see me back in 4-6 weeks.   --Dr. Raeford Razor

## 2019-09-03 ENCOUNTER — Other Ambulatory Visit: Payer: Self-pay

## 2019-09-03 ENCOUNTER — Ambulatory Visit (INDEPENDENT_AMBULATORY_CARE_PROVIDER_SITE_OTHER): Payer: 59

## 2019-09-03 DIAGNOSIS — M25561 Pain in right knee: Secondary | ICD-10-CM | POA: Diagnosis not present

## 2019-09-03 DIAGNOSIS — M23203 Derangement of unspecified medial meniscus due to old tear or injury, right knee: Secondary | ICD-10-CM

## 2019-09-03 DIAGNOSIS — S8991XA Unspecified injury of right lower leg, initial encounter: Secondary | ICD-10-CM | POA: Diagnosis not present

## 2019-09-06 ENCOUNTER — Telehealth: Payer: Self-pay | Admitting: Family Medicine

## 2019-09-06 NOTE — Telephone Encounter (Signed)
Left VM for patient. If he calls back please have him speak with a nurse/CMA and inform that he has some degenerative changes behind the knee and a chronic fragmentation of the tibial tuberosity. Also showing some mild medial joint space narrowing. Could consider PT if pain ongoing or gel injection.   If any questions then please take the best time and phone number to call and I will try to call him back.   Rosemarie Ax, MD [facility] 09/06/2019, 8:45 AM

## 2019-09-20 MED FILL — ULTICARE PEN NDL 4MM 32G: 32G X 4 MM | 90 days supply | Qty: 100 | Fill #2

## 2019-09-20 MED FILL — PANTOPRAZOLE SOD DR 40 MG T: 40 | 90 days supply | Qty: 90 | Fill #2

## 2019-10-11 MED FILL — VICTOZA 18 MG/3 ML INJECT P: 18 | 30 days supply | Qty: 9 | Fill #7

## 2019-10-29 DIAGNOSIS — L02212 Cutaneous abscess of back [any part, except buttock]: Secondary | ICD-10-CM | POA: Diagnosis not present

## 2019-11-04 MED FILL — VICTOZA 18 MG/3 ML INJECT P: 18 | 30 days supply | Qty: 9 | Fill #8

## 2019-11-09 DIAGNOSIS — M1711 Unilateral primary osteoarthritis, right knee: Secondary | ICD-10-CM | POA: Diagnosis not present

## 2019-11-09 DIAGNOSIS — E1169 Type 2 diabetes mellitus with other specified complication: Secondary | ICD-10-CM | POA: Diagnosis not present

## 2019-11-09 DIAGNOSIS — G4733 Obstructive sleep apnea (adult) (pediatric): Secondary | ICD-10-CM | POA: Diagnosis not present

## 2019-11-09 DIAGNOSIS — K219 Gastro-esophageal reflux disease without esophagitis: Secondary | ICD-10-CM | POA: Diagnosis not present

## 2019-11-09 DIAGNOSIS — Z23 Encounter for immunization: Secondary | ICD-10-CM | POA: Diagnosis not present

## 2019-11-09 DIAGNOSIS — E785 Hyperlipidemia, unspecified: Secondary | ICD-10-CM | POA: Diagnosis not present

## 2019-11-09 DIAGNOSIS — Z0000 Encounter for general adult medical examination without abnormal findings: Secondary | ICD-10-CM | POA: Diagnosis not present

## 2019-11-09 DIAGNOSIS — N400 Enlarged prostate without lower urinary tract symptoms: Secondary | ICD-10-CM | POA: Diagnosis not present

## 2019-11-09 DIAGNOSIS — I10 Essential (primary) hypertension: Secondary | ICD-10-CM | POA: Diagnosis not present

## 2019-11-30 ENCOUNTER — Other Ambulatory Visit (HOSPITAL_BASED_OUTPATIENT_CLINIC_OR_DEPARTMENT_OTHER): Payer: Self-pay | Admitting: Family Medicine

## 2019-11-30 MED FILL — VICTOZA 18 MG/3 ML INJECT P: 18 | 30 days supply | Qty: 9 | Fill #9

## 2019-11-30 MED FILL — VERAPAMIL HCL 120 MG TABS: 120 | 90 days supply | Qty: 90 | Fill #1

## 2019-11-30 MED FILL — metFORMIN HCL ER 500 MG TB2: 500 | 30 days supply | Qty: 120 | Fill #0

## 2019-12-07 DIAGNOSIS — E119 Type 2 diabetes mellitus without complications: Secondary | ICD-10-CM | POA: Diagnosis not present

## 2019-12-07 DIAGNOSIS — H40013 Open angle with borderline findings, low risk, bilateral: Secondary | ICD-10-CM | POA: Diagnosis not present

## 2019-12-23 MED FILL — PANTOPRAZOLE SOD DR 40 MG T: 40 | 90 days supply | Qty: 90 | Fill #3

## 2019-12-23 MED FILL — ULTICARE PEN NDL 4MM 32G: 32G X 4 MM | 90 days supply | Qty: 100 | Fill #3

## 2019-12-24 MED FILL — VICTOZA 18 MG/3 ML INJECT P: 18 | 30 days supply | Qty: 9 | Fill #10

## 2019-12-24 MED FILL — metFORMIN HCL ER 500 MG TB2: 500 | 30 days supply | Qty: 120 | Fill #1

## 2020-01-26 MED FILL — metFORMIN HCL ER 500 MG TB2: 500 | 30 days supply | Qty: 120 | Fill #2

## 2020-01-26 MED FILL — VICTOZA 18 MG/3 ML INJECT P: 18 | 30 days supply | Qty: 9 | Fill #11

## 2020-02-24 MED FILL — metFORMIN HCL ER 500 MG TB2: 500 | 30 days supply | Qty: 120 | Fill #3

## 2020-02-24 MED FILL — VERAPAMIL HCL 120 MG TABS: 120 | 90 days supply | Qty: 90 | Fill #2

## 2020-02-24 MED FILL — VICTOZA 18 MG/3 ML INJECT P: 18 | 30 days supply | Qty: 9 | Fill #0

## 2020-03-17 DIAGNOSIS — G4733 Obstructive sleep apnea (adult) (pediatric): Secondary | ICD-10-CM | POA: Diagnosis not present

## 2020-03-17 DIAGNOSIS — K219 Gastro-esophageal reflux disease without esophagitis: Secondary | ICD-10-CM | POA: Diagnosis not present

## 2020-03-17 DIAGNOSIS — Z713 Dietary counseling and surveillance: Secondary | ICD-10-CM | POA: Diagnosis not present

## 2020-03-17 DIAGNOSIS — E785 Hyperlipidemia, unspecified: Secondary | ICD-10-CM | POA: Diagnosis not present

## 2020-03-17 DIAGNOSIS — E1169 Type 2 diabetes mellitus with other specified complication: Secondary | ICD-10-CM | POA: Diagnosis not present

## 2020-03-17 DIAGNOSIS — M1711 Unilateral primary osteoarthritis, right knee: Secondary | ICD-10-CM | POA: Diagnosis not present

## 2020-03-17 DIAGNOSIS — Z9989 Dependence on other enabling machines and devices: Secondary | ICD-10-CM | POA: Diagnosis not present

## 2020-03-21 ENCOUNTER — Ambulatory Visit: Payer: 59 | Attending: Internal Medicine

## 2020-03-21 DIAGNOSIS — Z23 Encounter for immunization: Secondary | ICD-10-CM

## 2020-03-21 MED FILL — FLUARIX QUADRIVALENT 0.5 ML: 0.5 | 1 days supply | Qty: 1 | Fill #0

## 2020-03-21 MED FILL — PFIZER-BIONTECH COVID-19 VA: 30 | 1 days supply | Qty: 0 | Fill #0

## 2020-03-21 NOTE — Progress Notes (Signed)
   Covid-19 Vaccination Clinic  Name:  Benjamin Lee    MRN: [id number] DOB: [birth year]  03/21/2020  [name] was observed post Covid-19 immunization for 15 minutes without incident. He was provided with Vaccine Information Sheet and instruction to access the V-Safe system.  Vaccinated by [facility]  [name] was instructed to call 911 with any severe reactions post vaccine: Marland Kitchen Difficulty breathing  . Swelling of face and throat  . A fast heartbeat  . A bad rash all over body  . Dizziness and weakness

## 2020-03-24 MED FILL — ZOLPIDEM TARTRATE 10 MG TAB: 10 | 90 days supply | Qty: 90 | Fill #0

## 2020-03-24 MED FILL — metFORMIN HCL ER 500 MG TB2: 500 | 90 days supply | Qty: 360 | Fill #4

## 2020-03-24 MED FILL — ULTICARE PEN NDL 4MM 32G: 32G X 4 MM | 90 days supply | Qty: 100 | Fill #0

## 2020-03-24 MED FILL — VICTOZA 18 MG/3 ML INJECT P: 18 | 90 days supply | Qty: 27 | Fill #1

## 2020-06-01 MED FILL — VERAPAMIL HCL 120 MG TABS: 120 | 90 days supply | Qty: 90 | Fill #3

## 2020-09-04 ENCOUNTER — Other Ambulatory Visit (HOSPITAL_BASED_OUTPATIENT_CLINIC_OR_DEPARTMENT_OTHER): Payer: Self-pay | Admitting: Family Medicine

## 2020-09-04 MED FILL — VERAPAMIL HCL 120 MG TABS: 120 | 90 days supply | Qty: 90 | Fill #0

## 2020-09-04 MED FILL — ZOLPIDEM TARTRATE 10 MG TAB: 10 | 90 days supply | Qty: 90 | Fill #0

## 2020-09-04 MED FILL — metFORMIN HCL ER 500 MG TB2: 500 | 60 days supply | Qty: 240 | Fill #5

## 2020-12-14 ENCOUNTER — Other Ambulatory Visit (HOSPITAL_BASED_OUTPATIENT_CLINIC_OR_DEPARTMENT_OTHER): Payer: Self-pay

## 2020-12-14 MED FILL — Verapamil HCl Tab 120 MG: ORAL | 90 days supply | Qty: 90 | Fill #0 | Status: AC

## 2021-04-04 ENCOUNTER — Other Ambulatory Visit (HOSPITAL_BASED_OUTPATIENT_CLINIC_OR_DEPARTMENT_OTHER): Payer: Self-pay

## 2021-04-04 MED FILL — Verapamil HCl Tab 120 MG: ORAL | 90 days supply | Qty: 90 | Fill #1 | Status: AC

## 2021-07-13 ENCOUNTER — Other Ambulatory Visit (HOSPITAL_BASED_OUTPATIENT_CLINIC_OR_DEPARTMENT_OTHER): Payer: Self-pay

## 2021-07-13 MED FILL — Verapamil HCl Tab 120 MG: ORAL | 90 days supply | Qty: 90 | Fill #2 | Status: AC

## 2021-08-27 ENCOUNTER — Other Ambulatory Visit (HOSPITAL_BASED_OUTPATIENT_CLINIC_OR_DEPARTMENT_OTHER): Payer: Self-pay

## 2021-08-27 ENCOUNTER — Emergency Department (HOSPITAL_BASED_OUTPATIENT_CLINIC_OR_DEPARTMENT_OTHER): Payer: Self-pay

## 2021-08-27 ENCOUNTER — Encounter (HOSPITAL_BASED_OUTPATIENT_CLINIC_OR_DEPARTMENT_OTHER): Payer: Self-pay | Admitting: *Deleted

## 2021-08-27 ENCOUNTER — Other Ambulatory Visit: Payer: Self-pay

## 2021-08-27 ENCOUNTER — Emergency Department (HOSPITAL_BASED_OUTPATIENT_CLINIC_OR_DEPARTMENT_OTHER)
Admission: EM | Admit: 2021-08-27 | Discharge: 2021-08-27 | Disposition: A | Discharge status: Home / Self Care | Payer: Self-pay | Attending: Emergency Medicine | Admitting: Emergency Medicine

## 2021-08-27 DIAGNOSIS — Z7984 Long term (current) use of oral hypoglycemic drugs: Secondary | ICD-10-CM | POA: Insufficient documentation

## 2021-08-27 DIAGNOSIS — K760 Fatty (change of) liver, not elsewhere classified: Secondary | ICD-10-CM | POA: Diagnosis not present

## 2021-08-27 DIAGNOSIS — N132 Hydronephrosis with renal and ureteral calculous obstruction: Secondary | ICD-10-CM | POA: Diagnosis not present

## 2021-08-27 DIAGNOSIS — K4090 Unilateral inguinal hernia, without obstruction or gangrene, not specified as recurrent: Secondary | ICD-10-CM | POA: Diagnosis not present

## 2021-08-27 DIAGNOSIS — N200 Calculus of kidney: Secondary | ICD-10-CM

## 2021-08-27 DIAGNOSIS — E119 Type 2 diabetes mellitus without complications: Secondary | ICD-10-CM | POA: Insufficient documentation

## 2021-08-27 LAB — URINALYSIS, ROUTINE W REFLEX MICROSCOPIC
Bilirubin Urine: NEGATIVE
Glucose, UA: NEGATIVE mg/dL
Ketones, ur: 40 mg/dL — AB
Leukocytes,Ua: NEGATIVE
Nitrite: NEGATIVE
Protein, ur: NEGATIVE mg/dL
Specific Gravity, Urine: 1.02 (ref 1.005–1.030)
pH: 8 (ref 5.0–8.0)

## 2021-08-27 LAB — CBC WITH DIFFERENTIAL/PLATELET
Abs Immature Granulocytes: 0.05 10*3/uL (ref 0.00–0.07)
Basophils Absolute: 0 10*3/uL (ref 0.0–0.1)
Basophils Relative: 1 %
Eosinophils Absolute: 0.2 10*3/uL (ref 0.0–0.5)
Eosinophils Relative: 2 %
HCT: 46.8 % (ref 39.0–52.0)
Hemoglobin: 16.4 g/dL (ref 13.0–17.0)
Immature Granulocytes: 1 %
Lymphocytes Relative: 31 %
Lymphs Abs: 2.5 10*3/uL (ref 0.7–4.0)
MCH: 29.7 pg (ref 26.0–34.0)
MCHC: 35 g/dL (ref 30.0–36.0)
MCV: 84.6 fL (ref 80.0–100.0)
Monocytes Absolute: 0.5 10*3/uL (ref 0.1–1.0)
Monocytes Relative: 7 %
Neutro Abs: 4.8 10*3/uL (ref 1.7–7.7)
Neutrophils Relative %: 58 %
Platelets: 226 10*3/uL (ref 150–400)
RBC: 5.53 MIL/uL (ref 4.22–5.81)
RDW: 12.8 % (ref 11.5–15.5)
WBC: 8.2 10*3/uL (ref 4.0–10.5)
nRBC: 0 % (ref 0.0–0.2)

## 2021-08-27 LAB — COMPREHENSIVE METABOLIC PANEL
ALT: 52 U/L — ABNORMAL HIGH (ref 0–44)
AST: 37 U/L (ref 15–41)
Albumin: 4.6 g/dL (ref 3.5–5.0)
Alkaline Phosphatase: 44 U/L (ref 38–126)
Anion gap: 14 (ref 5–15)
BUN: 15 mg/dL (ref 6–20)
CO2: 19 mmol/L — ABNORMAL LOW (ref 22–32)
Calcium: 9.4 mg/dL (ref 8.9–10.3)
Chloride: 103 mmol/L (ref 98–111)
Creatinine, Ser: 1.09 mg/dL (ref 0.61–1.24)
GFR, Estimated: >60 mL/min (ref >60)
Glucose, Bld: 219 mg/dL — ABNORMAL HIGH (ref 70–99)
Potassium: 3.5 mmol/L (ref 3.5–5.1)
Sodium: 136 mmol/L (ref 135–145)
Total Bilirubin: 1 mg/dL (ref 0.3–1.2)
Total Protein: 8.6 g/dL — ABNORMAL HIGH (ref 6.5–8.1)

## 2021-08-27 LAB — URINALYSIS, MICROSCOPIC (REFLEX): RBC / HPF: >50 RBC/hpf (ref 0–5)

## 2021-08-27 MED ORDER — KETOROLAC TROMETHAMINE 15 MG/ML IJ SOLN
15.0000 mg | Freq: Once | INTRAMUSCULAR | Status: AC
  Administered 2021-08-27: 15 mg via INTRAVENOUS
  Filled 2021-08-27: qty 1

## 2021-08-27 MED ORDER — ONDANSETRON HCL 4 MG/2ML IJ SOLN
4.0000 mg | Freq: Once | INTRAMUSCULAR | Status: AC
  Administered 2021-08-27: 4 mg via INTRAVENOUS
  Filled 2021-08-27: qty 2

## 2021-08-27 MED ORDER — ONDANSETRON 4 MG PO TBDP
4.0000 mg | ORAL_TABLET | Freq: Three times a day (TID) | ORAL | 0 refills | Status: AC | PRN
  Filled 2021-08-27: qty 20, 7d supply, fill #0

## 2021-08-27 MED ORDER — TAMSULOSIN HCL 0.4 MG PO CAPS
0.4000 mg | ORAL_CAPSULE | Freq: Every day | ORAL | 0 refills | Status: AC
  Filled 2021-08-27: qty 30, 30d supply, fill #0

## 2021-08-27 MED ORDER — OXYCODONE-ACETAMINOPHEN 5-325 MG PO TABS
1.0000 | ORAL_TABLET | Freq: Four times a day (QID) | ORAL | 0 refills | Status: AC | PRN
  Filled 2021-08-27: qty 10, 3d supply, fill #0

## 2021-08-27 MED ORDER — FENTANYL CITRATE PF 50 MCG/ML IJ SOSY
50.0000 ug | PREFILLED_SYRINGE | Freq: Once | INTRAMUSCULAR | Status: AC
  Administered 2021-08-27: 50 ug via INTRAVENOUS
  Filled 2021-08-27: qty 1

## 2021-08-27 NOTE — ED Notes (Signed)
ED Provider at bedside. 

## 2021-08-27 NOTE — Discharge Instructions (Addendum)
Follow-up with urology.  Come back to ER if you develop uncontrolled pain, vomiting, fever or other new concerning symptom.

## 2021-08-27 NOTE — ED Provider Notes (Signed)
[facility] EMERGENCY DEPARTMENT Provider Note   CSN: [id number] Arrival date & time: 08/27/21  1531     History  Chief Complaint  Patient presents with   Abdominal Pain    Benjamin Lee is a 56 y.o. male.  Presented to the emergency department with concern for flank pain.  Pain started about 730 this morning, got better but then a couple hours ago started to get really bad again.  Sharp, stabbing, nonradiating, isolated to right flank.  Feels similar to prior kidney stone.  No nausea or vomiting.  No fever or chills.  Has history of diabetes.  Reviewed chart, last PCP visit in September 2021, diabetes, OSA  HPI     Home Medications Prior to Admission medications   Medication Sig Start Date End Date Taking? Authorizing Provider  ondansetron (ZOFRAN-ODT) 4 MG disintegrating tablet Take 1 tablet (4 mg total) by mouth every 8 (eight) hours as needed for nausea or vomiting. 08/27/21  Yes Chrsitopher Wik, Ellwood Dense, MD  oxyCODONE-acetaminophen (PERCOCET/ROXICET) 5-325 MG tablet Take 1 tablet by mouth every 6 (six) hours as needed for severe pain. 08/27/21  Yes Lucrezia Starch, MD  tamsulosin (FLOMAX) 0.4 MG CAPS capsule Take 1 capsule (0.4 mg total) by mouth daily. 08/27/21  Yes Lucrezia Starch, MD  HYDROcodone-acetaminophen (NORCO/VICODIN) 5-325 MG tablet Take 1 tablet by mouth every 6 (six) hours as needed for moderate pain or severe pain. Patient not taking: Reported on 05/11/2019 02/28/19   Kandra Nicolas, MD  Liraglutide (VICTOZA [place]) Inject 1.8 Units into the skin daily.    [provider]  metFORMIN (GLUCOPHAGE-XR) 500 MG 24 hr tablet 2,000 mg daily.  07/29/18   [provider]  metFORMIN (GLUCOPHAGE-XR) 500 MG 24 hr tablet TAKE 4 TABLETS BY MOUTH DAILY WITH BREAKFAST 11/30/19 11/29/20  Franki Monte, MD  Multiple Vitamins-Minerals (MULTIVITAMIN WITH MINERALS) tablet Take 1 tablet by mouth daily.    [provider]  pantoprazole (PROTONIX) 40 MG  tablet Take 1 tablet (40 mg total) by mouth daily. 05/11/19   Jackquline Denmark, MD  predniSONE (DELTASONE) 5 MG tablet Take 6 pills for first day, 5 pills second day, 4 pills third day, 3 pills fourth day, 2 pills the fifth day, and 1 pill sixth day. 07/27/19   Rosemarie Ax, MD  verapamil (CALAN) 120 MG tablet Take 120 mg by mouth daily.     [provider]  verapamil (CALAN) 120 MG tablet TAKE 1 TABLET (120 MG TOTAL) BY MOUTH DAILY. 09/04/20 10/16/21  Franki Monte, MD  zolpidem (AMBIEN) 10 MG tablet Take 10 mg by mouth at bedtime as needed for sleep.    [provider]  zolpidem (AMBIEN) 10 MG tablet TAKE 1 TABLET BY MOUTH AT BEDTIME IF NEEDED FOR SLEEP 09/04/20 03/03/21  Franki Monte, MD      Allergies    Patient has no known allergies.    Review of Systems   Review of Systems  Constitutional:  Negative for chills and fever.  HENT:  Negative for ear pain and sore throat.   Eyes:  Negative for pain and visual disturbance.  Respiratory:  Negative for cough and shortness of breath.   Cardiovascular:  Negative for chest pain and palpitations.  Gastrointestinal:  Negative for abdominal pain and vomiting.  Genitourinary:  Positive for flank pain. Negative for dysuria and hematuria.  Musculoskeletal:  Negative for arthralgias and back pain.  Skin:  Negative for color change and rash.  Neurological:  Negative  for seizures and syncope.  All other systems reviewed and are negative.  Physical Exam Updated Vital Signs BP 102/76 (BP Location: Right Arm)    Pulse 87    Temp 97.7 F (36.5 C) (Oral)    Resp 18    Ht 6\' 1"  (1.854 m)    Wt (!) 163.3 kg    SpO2 100%    BMI 47.50 kg/m  Physical Exam Vitals and nursing note reviewed.  Constitutional:      General: He is not in acute distress.    Appearance: He is well-developed.  HENT:     Head: Normocephalic and atraumatic.  Eyes:     Conjunctiva/sclera: Conjunctivae normal.  Cardiovascular:     Rate and Rhythm: Normal rate and  regular rhythm.     Heart sounds: No murmur heard. Pulmonary:     Effort: Pulmonary effort is normal. No respiratory distress.     Breath sounds: Normal breath sounds.  Abdominal:     Palpations: Abdomen is soft.     Tenderness: There is abdominal tenderness in the right lower quadrant. There is no guarding or rebound.  Musculoskeletal:        General: No swelling.     Cervical back: Neck supple.  Skin:    General: Skin is warm and dry.     Capillary Refill: Capillary refill takes less than 2 seconds.  Neurological:     Mental Status: He is alert.  Psychiatric:        Mood and Affect: Mood normal.    ED Results / Procedures / Treatments   Labs (all labs ordered are listed, but only abnormal results are displayed) Labs Reviewed  COMPREHENSIVE METABOLIC PANEL - Abnormal; Notable for the following components:      Result Value   CO2 19 (*)    Glucose, Bld 219 (*)    Total Protein 8.6 (*)    ALT 52 (*)    All other components within normal limits  URINALYSIS, ROUTINE W REFLEX MICROSCOPIC - Abnormal; Notable for the following components:   Color, Urine AMBER (*)    APPearance CLOUDY (*)    Hgb urine dipstick LARGE (*)    Ketones, ur 40 (*)    All other components within normal limits  URINALYSIS, MICROSCOPIC (REFLEX) - Abnormal; Notable for the following components:   Bacteria, UA RARE (*)    All other components within normal limits  CBC WITH DIFFERENTIAL/PLATELET    EKG None  Radiology CT Renal Stone Study  Result Date: 08/27/2021 CLINICAL DATA:  Flank pain.  History of kidney stones. EXAM: CT ABDOMEN AND PELVIS WITHOUT CONTRAST TECHNIQUE: Multidetector CT imaging of the abdomen and pelvis was performed following the standard protocol without IV contrast. RADIATION DOSE REDUCTION: This exam was performed according to the departmental dose-optimization program which includes automated exposure control, adjustment of the mA and/or kV according to patient size and/or use of  iterative reconstruction technique. COMPARISON:  CT abdomen pelvis dated March 26, 2019. FINDINGS: Lower chest: No acute abnormality. Hepatobiliary: Unchanged diffusely decreased liver density. No focal liver abnormality. The gallbladder is unremarkable. No biliary dilatation. Pancreas: Unremarkable. No pancreatic ductal dilatation or surrounding inflammatory changes. Spleen: Normal in size without focal abnormality. Adrenals/Urinary Tract: Adrenal glands are unremarkable. 3 mm calculus in the distal right ureter with borderline right hydronephrosis and mild periureteral fat stranding. Additional punctate bilateral renal calculi. No renal mass. The bladder is unremarkable. Stomach/Bowel: Stomach is within normal limits. Appendix appears normal. No evidence of bowel  wall thickening, distention, or inflammatory changes. Vascular/Lymphatic: No significant vascular findings are present. No enlarged abdominal or pelvic lymph nodes. Reproductive: Prostate is unremarkable. Other: Unchanged small fat containing left inguinal hernia. No free fluid or pneumoperitoneum. Musculoskeletal: No acute or significant osseous findings. IMPRESSION: 1. 3 mm calculus in the distal right ureter with borderline right hydronephrosis. 2. Additional punctate nonobstructive bilateral nephrolithiasis. 3. Unchanged hepatic steatosis. Electronically Signed   By: Titus Dubin M.D.   On: 08/27/2021 16:33    Procedures Procedures    Medications Ordered in ED Medications  fentaNYL (SUBLIMAZE) injection 50 mcg (50 mcg Intravenous Given 08/27/21 1555)  ondansetron (ZOFRAN) injection 4 mg (4 mg Intravenous Given 08/27/21 1555)  ketorolac (TORADOL) 15 MG/ML injection 15 mg (15 mg Intravenous Given 08/27/21 1607)    ED Course/ Medical Decision Making/ A&P                           Medical Decision Making Amount and/or Complexity of Data Reviewed Labs: ordered. Radiology: ordered.  Risk Prescription drug  management.   56 year old male presenting to ER with concern for right flank pain.  CT renal concerning for 30mm distal right ureter stone. Culprit for symptoms today. Labs stable, no leukocytosis, no renal dysfunction. UA negative for infection. Pain is well controlled. Will dc home. Rec f/u urology. Provide rx for pain, nausea, flomax.   After the discussed management above, the patient was determined to be safe for discharge.  The patient was in agreement with this plan and all questions regarding their care were answered.  ED return precautions were discussed and the patient will return to the ED with any significant worsening of condition.         Final Clinical Impression(s) / ED Diagnoses Final diagnoses:  Nephrolithiasis    Rx / DC Orders ED Discharge Orders          Ordered    ondansetron (ZOFRAN-ODT) 4 MG disintegrating tablet  Every 8 hours PRN        08/27/21 1712    oxyCODONE-acetaminophen (PERCOCET/ROXICET) 5-325 MG tablet  Every 6 hours PRN        08/27/21 1712    tamsulosin (FLOMAX) 0.4 MG CAPS capsule  Daily        08/27/21 1712              Lucrezia Starch, MD 08/27/21 1754

## 2021-08-27 NOTE — ED Triage Notes (Signed)
Rt flank and back onset 0730 this am  went away till 2 hours ago  hx kidney stones

## 2022-02-06 ENCOUNTER — Other Ambulatory Visit (HOSPITAL_BASED_OUTPATIENT_CLINIC_OR_DEPARTMENT_OTHER): Payer: Self-pay

## 2022-02-06 DIAGNOSIS — E785 Hyperlipidemia, unspecified: Secondary | ICD-10-CM | POA: Diagnosis not present

## 2022-02-06 DIAGNOSIS — E1169 Type 2 diabetes mellitus with other specified complication: Secondary | ICD-10-CM | POA: Diagnosis not present

## 2022-02-06 DIAGNOSIS — E1165 Type 2 diabetes mellitus with hyperglycemia: Secondary | ICD-10-CM | POA: Diagnosis not present

## 2022-02-06 DIAGNOSIS — Z23 Encounter for immunization: Secondary | ICD-10-CM | POA: Diagnosis not present

## 2022-02-06 DIAGNOSIS — Z125 Encounter for screening for malignant neoplasm of prostate: Secondary | ICD-10-CM | POA: Diagnosis not present

## 2022-02-06 DIAGNOSIS — K224 Dyskinesia of esophagus: Secondary | ICD-10-CM | POA: Diagnosis not present

## 2022-02-06 DIAGNOSIS — G4700 Insomnia, unspecified: Secondary | ICD-10-CM | POA: Diagnosis not present

## 2022-02-06 DIAGNOSIS — Z6841 Body Mass Index (BMI) 40.0 and over, adult: Secondary | ICD-10-CM | POA: Diagnosis not present

## 2022-02-06 DIAGNOSIS — I10 Essential (primary) hypertension: Secondary | ICD-10-CM | POA: Diagnosis not present

## 2022-02-06 MED ORDER — FARXIGA 5 MG PO TABS
ORAL_TABLET | ORAL | 2 refills | Status: DC
  Filled 2022-02-06: qty 30, 30d supply, fill #0
  Filled 2022-03-07: qty 30, 30d supply, fill #1
  Filled 2022-04-15: qty 30, 30d supply, fill #2

## 2022-02-06 MED ORDER — ROSUVASTATIN CALCIUM 10 MG PO TABS
ORAL_TABLET | ORAL | 2 refills | Status: DC
  Filled 2022-02-06: qty 30, 30d supply, fill #0
  Filled 2022-03-07: qty 30, 30d supply, fill #1
  Filled 2022-04-15: qty 30, 30d supply, fill #2

## 2022-02-06 MED ORDER — VERAPAMIL HCL 120 MG PO TABS
ORAL_TABLET | ORAL | 3 refills | Status: AC
  Filled 2022-02-06: qty 48, 48d supply, fill #0
  Filled 2022-02-07: qty 42, 42d supply, fill #0
  Filled 2022-05-01: qty 90, 90d supply, fill #1
  Filled 2022-07-29: qty 90, 90d supply, fill #2
  Filled 2022-10-29: qty 90, 90d supply, fill #3

## 2022-02-06 MED ORDER — LISINOPRIL 5 MG PO TABS
ORAL_TABLET | ORAL | 0 refills | Status: DC
  Filled 2022-02-06: qty 90, 90d supply, fill #0

## 2022-02-06 MED ORDER — VICTOZA 18 MG/3ML ~~LOC~~ SOPN
PEN_INJECTOR | SUBCUTANEOUS | 1 refills | Status: AC
  Filled 2022-02-06: qty 9, 45d supply, fill #0

## 2022-02-07 ENCOUNTER — Other Ambulatory Visit (HOSPITAL_BASED_OUTPATIENT_CLINIC_OR_DEPARTMENT_OTHER): Payer: Self-pay

## 2022-02-08 ENCOUNTER — Other Ambulatory Visit (HOSPITAL_BASED_OUTPATIENT_CLINIC_OR_DEPARTMENT_OTHER): Payer: Self-pay

## 2022-02-08 MED ORDER — OZEMPIC (0.25 OR 0.5 MG/DOSE) 2 MG/3ML ~~LOC~~ SOPN
0.2500 mg | PEN_INJECTOR | SUBCUTANEOUS | 0 refills | Status: DC
  Filled 2022-02-08: qty 3, 42d supply, fill #0

## 2022-02-11 ENCOUNTER — Other Ambulatory Visit (HOSPITAL_BASED_OUTPATIENT_CLINIC_OR_DEPARTMENT_OTHER): Payer: Self-pay

## 2022-02-14 ENCOUNTER — Other Ambulatory Visit (HOSPITAL_BASED_OUTPATIENT_CLINIC_OR_DEPARTMENT_OTHER): Payer: Self-pay

## 2022-02-15 ENCOUNTER — Other Ambulatory Visit (HOSPITAL_BASED_OUTPATIENT_CLINIC_OR_DEPARTMENT_OTHER): Payer: Self-pay

## 2022-03-07 ENCOUNTER — Other Ambulatory Visit (HOSPITAL_COMMUNITY): Payer: Self-pay

## 2022-03-08 ENCOUNTER — Other Ambulatory Visit (HOSPITAL_COMMUNITY): Payer: Self-pay

## 2022-03-19 ENCOUNTER — Other Ambulatory Visit (HOSPITAL_COMMUNITY): Payer: Self-pay

## 2022-04-01 DIAGNOSIS — M9902 Segmental and somatic dysfunction of thoracic region: Secondary | ICD-10-CM | POA: Diagnosis not present

## 2022-04-01 DIAGNOSIS — M531 Cervicobrachial syndrome: Secondary | ICD-10-CM | POA: Diagnosis not present

## 2022-04-01 DIAGNOSIS — M9901 Segmental and somatic dysfunction of cervical region: Secondary | ICD-10-CM | POA: Diagnosis not present

## 2022-04-01 DIAGNOSIS — M5032 Other cervical disc degeneration, mid-cervical region, unspecified level: Secondary | ICD-10-CM | POA: Diagnosis not present

## 2022-04-08 DIAGNOSIS — M531 Cervicobrachial syndrome: Secondary | ICD-10-CM | POA: Diagnosis not present

## 2022-04-08 DIAGNOSIS — E113293 Type 2 diabetes mellitus with mild nonproliferative diabetic retinopathy without macular edema, bilateral: Secondary | ICD-10-CM | POA: Diagnosis not present

## 2022-04-08 DIAGNOSIS — M9901 Segmental and somatic dysfunction of cervical region: Secondary | ICD-10-CM | POA: Diagnosis not present

## 2022-04-08 DIAGNOSIS — M9902 Segmental and somatic dysfunction of thoracic region: Secondary | ICD-10-CM | POA: Diagnosis not present

## 2022-04-08 DIAGNOSIS — M5032 Other cervical disc degeneration, mid-cervical region, unspecified level: Secondary | ICD-10-CM | POA: Diagnosis not present

## 2022-04-15 ENCOUNTER — Other Ambulatory Visit (HOSPITAL_COMMUNITY): Payer: Self-pay

## 2022-04-16 ENCOUNTER — Other Ambulatory Visit (HOSPITAL_COMMUNITY): Payer: Self-pay

## 2022-04-16 DIAGNOSIS — M9902 Segmental and somatic dysfunction of thoracic region: Secondary | ICD-10-CM | POA: Diagnosis not present

## 2022-04-16 DIAGNOSIS — M9901 Segmental and somatic dysfunction of cervical region: Secondary | ICD-10-CM | POA: Diagnosis not present

## 2022-04-16 DIAGNOSIS — M531 Cervicobrachial syndrome: Secondary | ICD-10-CM | POA: Diagnosis not present

## 2022-04-16 DIAGNOSIS — M5032 Other cervical disc degeneration, mid-cervical region, unspecified level: Secondary | ICD-10-CM | POA: Diagnosis not present

## 2022-04-29 ENCOUNTER — Other Ambulatory Visit (HOSPITAL_BASED_OUTPATIENT_CLINIC_OR_DEPARTMENT_OTHER): Payer: Self-pay

## 2022-04-29 MED ORDER — OZEMPIC (0.25 OR 0.5 MG/DOSE) 2 MG/3ML ~~LOC~~ SOPN
0.5000 mg | PEN_INJECTOR | SUBCUTANEOUS | 0 refills | Status: AC
  Filled 2022-04-29: qty 3, 28d supply, fill #0

## 2022-05-01 ENCOUNTER — Other Ambulatory Visit (HOSPITAL_COMMUNITY): Payer: Self-pay

## 2022-05-01 MED ORDER — LISINOPRIL 5 MG PO TABS
5.0000 mg | ORAL_TABLET | Freq: Every day | ORAL | 1 refills | Status: DC
  Filled 2022-05-01: qty 90, 90d supply, fill #0
  Filled 2022-07-29: qty 90, 90d supply, fill #1

## 2022-05-02 ENCOUNTER — Other Ambulatory Visit (HOSPITAL_COMMUNITY): Payer: Self-pay

## 2022-05-03 ENCOUNTER — Other Ambulatory Visit (HOSPITAL_COMMUNITY): Payer: Self-pay

## 2022-05-09 ENCOUNTER — Other Ambulatory Visit (HOSPITAL_BASED_OUTPATIENT_CLINIC_OR_DEPARTMENT_OTHER): Payer: Self-pay

## 2022-05-09 DIAGNOSIS — E785 Hyperlipidemia, unspecified: Secondary | ICD-10-CM | POA: Diagnosis not present

## 2022-05-09 DIAGNOSIS — E119 Type 2 diabetes mellitus without complications: Secondary | ICD-10-CM | POA: Diagnosis not present

## 2022-05-09 DIAGNOSIS — I10 Essential (primary) hypertension: Secondary | ICD-10-CM | POA: Diagnosis not present

## 2022-05-09 DIAGNOSIS — Z6841 Body Mass Index (BMI) 40.0 and over, adult: Secondary | ICD-10-CM | POA: Diagnosis not present

## 2022-05-09 DIAGNOSIS — E1169 Type 2 diabetes mellitus with other specified complication: Secondary | ICD-10-CM | POA: Diagnosis not present

## 2022-05-09 DIAGNOSIS — E1165 Type 2 diabetes mellitus with hyperglycemia: Secondary | ICD-10-CM | POA: Diagnosis not present

## 2022-05-09 MED ORDER — OZEMPIC (1 MG/DOSE) 4 MG/3ML ~~LOC~~ SOPN
1.0000 mg | PEN_INJECTOR | SUBCUTANEOUS | 0 refills | Status: DC
  Filled 2022-05-09 – 2022-05-21 (×3): qty 3, 28d supply, fill #0

## 2022-05-20 ENCOUNTER — Other Ambulatory Visit (HOSPITAL_COMMUNITY): Payer: Self-pay

## 2022-05-21 ENCOUNTER — Other Ambulatory Visit (HOSPITAL_COMMUNITY): Payer: Self-pay

## 2022-05-21 MED ORDER — FARXIGA 5 MG PO TABS
ORAL_TABLET | ORAL | 3 refills | Status: DC
  Filled 2022-05-21: qty 30, 30d supply, fill #0
  Filled 2022-06-15: qty 30, 30d supply, fill #1
  Filled 2022-07-16: qty 30, 30d supply, fill #2
  Filled 2022-08-15: qty 30, 30d supply, fill #3

## 2022-05-21 MED ORDER — ROSUVASTATIN CALCIUM 10 MG PO TABS
ORAL_TABLET | ORAL | 3 refills | Status: DC
  Filled 2022-05-21: qty 30, 30d supply, fill #0
  Filled 2022-06-15: qty 30, 30d supply, fill #1
  Filled 2022-07-16: qty 30, 30d supply, fill #2
  Filled 2022-08-15: qty 30, 30d supply, fill #3

## 2022-05-22 ENCOUNTER — Other Ambulatory Visit (HOSPITAL_COMMUNITY): Payer: Self-pay

## 2022-06-15 ENCOUNTER — Other Ambulatory Visit (HOSPITAL_COMMUNITY): Payer: Self-pay

## 2022-06-17 ENCOUNTER — Other Ambulatory Visit: Payer: Self-pay

## 2022-06-18 ENCOUNTER — Other Ambulatory Visit (HOSPITAL_COMMUNITY): Payer: Self-pay

## 2022-06-18 MED ORDER — OZEMPIC (1 MG/DOSE) 4 MG/3ML ~~LOC~~ SOPN
1.0000 mg | PEN_INJECTOR | SUBCUTANEOUS | 0 refills | Status: DC
  Filled 2022-06-18 (×2): qty 3, 28d supply, fill #0

## 2022-07-16 ENCOUNTER — Other Ambulatory Visit (HOSPITAL_COMMUNITY): Payer: Self-pay

## 2022-07-16 ENCOUNTER — Other Ambulatory Visit: Payer: Self-pay

## 2022-07-17 ENCOUNTER — Encounter (HOSPITAL_COMMUNITY): Payer: Self-pay

## 2022-07-17 ENCOUNTER — Other Ambulatory Visit (HOSPITAL_COMMUNITY): Payer: Self-pay

## 2022-07-17 MED ORDER — OZEMPIC (1 MG/DOSE) 4 MG/3ML ~~LOC~~ SOPN
1.0000 mg | PEN_INJECTOR | SUBCUTANEOUS | 0 refills | Status: AC
  Filled 2022-07-17: qty 3, 28d supply, fill #0

## 2022-07-29 ENCOUNTER — Other Ambulatory Visit (HOSPITAL_COMMUNITY): Payer: Self-pay

## 2022-08-02 ENCOUNTER — Other Ambulatory Visit (HOSPITAL_BASED_OUTPATIENT_CLINIC_OR_DEPARTMENT_OTHER): Payer: Self-pay

## 2022-08-12 DIAGNOSIS — G4700 Insomnia, unspecified: Secondary | ICD-10-CM | POA: Diagnosis not present

## 2022-08-12 DIAGNOSIS — Z79899 Other long term (current) drug therapy: Secondary | ICD-10-CM | POA: Diagnosis not present

## 2022-08-15 ENCOUNTER — Other Ambulatory Visit: Payer: Self-pay

## 2022-08-15 ENCOUNTER — Other Ambulatory Visit (HOSPITAL_COMMUNITY): Payer: Self-pay

## 2022-08-15 MED ORDER — OZEMPIC (2 MG/DOSE) 8 MG/3ML ~~LOC~~ SOPN
2.0000 mg | PEN_INJECTOR | SUBCUTANEOUS | 2 refills | Status: DC
  Filled 2022-08-15: qty 3, 28d supply, fill #0
  Filled 2022-09-08: qty 3, 28d supply, fill #1
  Filled 2022-10-05: qty 3, 28d supply, fill #2

## 2022-09-08 ENCOUNTER — Other Ambulatory Visit (HOSPITAL_COMMUNITY): Payer: Self-pay

## 2022-09-09 ENCOUNTER — Other Ambulatory Visit (HOSPITAL_COMMUNITY): Payer: Self-pay

## 2022-09-09 ENCOUNTER — Other Ambulatory Visit: Payer: Self-pay

## 2022-09-09 MED ORDER — ROSUVASTATIN CALCIUM 10 MG PO TABS
10.0000 mg | ORAL_TABLET | Freq: Every evening | ORAL | 3 refills | Status: AC
  Filled 2022-09-09 (×2): qty 30, 30d supply, fill #0
  Filled 2022-10-05: qty 30, 30d supply, fill #1
  Filled 2022-11-08: qty 30, 30d supply, fill #2

## 2022-09-09 MED ORDER — DAPAGLIFLOZIN PROPANEDIOL 5 MG PO TABS
5.0000 mg | ORAL_TABLET | Freq: Every day | ORAL | 3 refills | Status: DC
  Filled 2022-09-09 (×2): qty 30, 30d supply, fill #0
  Filled 2022-10-05: qty 30, 30d supply, fill #1
  Filled 2022-11-08: qty 30, 30d supply, fill #2

## 2022-10-05 ENCOUNTER — Other Ambulatory Visit (HOSPITAL_COMMUNITY): Payer: Self-pay

## 2022-10-07 ENCOUNTER — Other Ambulatory Visit: Payer: Self-pay

## 2022-10-14 ENCOUNTER — Encounter: Payer: Self-pay | Admitting: *Deleted

## 2022-10-29 ENCOUNTER — Other Ambulatory Visit: Payer: Self-pay

## 2022-10-29 ENCOUNTER — Other Ambulatory Visit (HOSPITAL_COMMUNITY): Payer: Self-pay

## 2022-11-01 ENCOUNTER — Other Ambulatory Visit (HOSPITAL_COMMUNITY): Payer: Self-pay

## 2022-11-04 ENCOUNTER — Other Ambulatory Visit (HOSPITAL_COMMUNITY): Payer: Self-pay

## 2022-11-04 MED ORDER — LISINOPRIL 5 MG PO TABS
5.0000 mg | ORAL_TABLET | Freq: Every day | ORAL | 1 refills | Status: AC
  Filled 2022-11-04: qty 90, 90d supply, fill #0

## 2022-11-05 ENCOUNTER — Other Ambulatory Visit: Payer: Self-pay

## 2022-11-08 ENCOUNTER — Other Ambulatory Visit (HOSPITAL_COMMUNITY): Payer: Self-pay

## 2022-11-09 ENCOUNTER — Other Ambulatory Visit (HOSPITAL_COMMUNITY): Payer: Self-pay

## 2022-11-09 ENCOUNTER — Other Ambulatory Visit (HOSPITAL_BASED_OUTPATIENT_CLINIC_OR_DEPARTMENT_OTHER): Payer: Self-pay

## 2022-11-11 ENCOUNTER — Other Ambulatory Visit (HOSPITAL_COMMUNITY): Payer: Self-pay

## 2022-11-11 ENCOUNTER — Other Ambulatory Visit: Payer: Self-pay

## 2022-11-11 MED ORDER — OZEMPIC (2 MG/DOSE) 8 MG/3ML ~~LOC~~ SOPN
2.0000 mg | PEN_INJECTOR | SUBCUTANEOUS | 2 refills | Status: DC
  Filled 2022-11-11: qty 3, 28d supply, fill #0
  Filled 2022-12-03 – 2022-12-13 (×2): qty 3, 28d supply, fill #1
  Filled 2023-01-06: qty 3, 28d supply, fill #2

## 2022-11-19 ENCOUNTER — Ambulatory Visit: Payer: 59 | Admitting: Family Medicine

## 2022-12-04 ENCOUNTER — Other Ambulatory Visit: Payer: Self-pay

## 2022-12-07 ENCOUNTER — Other Ambulatory Visit (HOSPITAL_COMMUNITY): Payer: Self-pay

## 2022-12-09 ENCOUNTER — Other Ambulatory Visit (HOSPITAL_COMMUNITY): Payer: Self-pay

## 2022-12-11 ENCOUNTER — Other Ambulatory Visit (HOSPITAL_COMMUNITY): Payer: Self-pay

## 2022-12-13 ENCOUNTER — Other Ambulatory Visit: Payer: Self-pay

## 2022-12-13 ENCOUNTER — Other Ambulatory Visit (HOSPITAL_COMMUNITY): Payer: Self-pay

## 2023-01-07 ENCOUNTER — Other Ambulatory Visit: Payer: Self-pay

## 2023-01-23 ENCOUNTER — Other Ambulatory Visit (HOSPITAL_COMMUNITY): Payer: Self-pay

## 2023-01-23 MED ORDER — DAPAGLIFLOZIN PROPANEDIOL 5 MG PO TABS
ORAL_TABLET | ORAL | 1 refills | Status: AC
  Filled 2023-01-23: qty 90, 90d supply, fill #0

## 2023-01-23 MED ORDER — OZEMPIC (2 MG/DOSE) 8 MG/3ML ~~LOC~~ SOPN: PEN_INJECTOR | SUBCUTANEOUS | 1 refills | Status: AC

## 2023-12-29 ENCOUNTER — Other Ambulatory Visit: Payer: Self-pay

## 2023-12-29 ENCOUNTER — Other Ambulatory Visit (HOSPITAL_COMMUNITY): Payer: Self-pay

## 2023-12-29 MED ORDER — LEVOTHYROXINE SODIUM 50 MCG PO TABS
50.0000 ug | ORAL_TABLET | Freq: Every day | ORAL | 1 refills | Status: AC
  Filled 2023-12-29: qty 90, 90d supply, fill #0

## 2023-12-30 ENCOUNTER — Other Ambulatory Visit: Payer: Self-pay

## 2024-01-01 ENCOUNTER — Other Ambulatory Visit: Payer: Self-pay
